# Patient Record
Sex: Female | Born: 1940
Health system: Southern US, Community
[De-identification: ages and names within clinical notes are randomized; demographics above are authoritative.]

## PROBLEM LIST (undated history)

## (undated) DIAGNOSIS — E782 Mixed hyperlipidemia: Secondary | ICD-10-CM

## (undated) DIAGNOSIS — M349 Systemic sclerosis, unspecified: Secondary | ICD-10-CM

## (undated) DIAGNOSIS — G4700 Insomnia, unspecified: Secondary | ICD-10-CM

## (undated) DIAGNOSIS — R5383 Other fatigue: Secondary | ICD-10-CM

## (undated) DIAGNOSIS — R7301 Impaired fasting glucose: Secondary | ICD-10-CM

## (undated) DIAGNOSIS — M2550 Pain in unspecified joint: Secondary | ICD-10-CM

## (undated) DIAGNOSIS — F3289 Other specified depressive episodes: Secondary | ICD-10-CM

## (undated) DIAGNOSIS — R195 Other fecal abnormalities: Secondary | ICD-10-CM

## (undated) DIAGNOSIS — F329 Major depressive disorder, single episode, unspecified: Secondary | ICD-10-CM

## (undated) DIAGNOSIS — F17200 Nicotine dependence, unspecified, uncomplicated: Secondary | ICD-10-CM

## (undated) DIAGNOSIS — E559 Vitamin D deficiency, unspecified: Secondary | ICD-10-CM

## (undated) HISTORY — DX: Insomnia, unspecified: G47.00

## (undated) HISTORY — DX: Major depressive disorder, single episode, unspecified: F32.9

## (undated) HISTORY — PX: TUBAL LIGATION: SHX77

## (undated) HISTORY — DX: Impaired fasting glucose: R73.01

## (undated) HISTORY — DX: Pain in unspecified joint: M25.50

## (undated) HISTORY — DX: Other fecal abnormalities: R19.5

## (undated) HISTORY — DX: Other fatigue: R53.83

## (undated) HISTORY — PX: WISDOM TOOTH EXTRACTION: SHX21

## (undated) HISTORY — DX: Nicotine dependence, unspecified, uncomplicated: F17.200

## (undated) HISTORY — PX: LASIK: SHX215

## (undated) HISTORY — DX: Vitamin D deficiency, unspecified: E55.9

## (undated) HISTORY — DX: Other specified depressive episodes: F32.89

## (undated) HISTORY — DX: Systemic sclerosis, unspecified: M34.9

## (undated) HISTORY — DX: Mixed hyperlipidemia: E78.2

---

## 2003-11-21 ENCOUNTER — Other Ambulatory Visit: Admission: RE | Admit: 2003-11-21 | Discharge: 2003-11-21 | Payer: Self-pay | Admitting: Internal Medicine

## 2004-11-23 ENCOUNTER — Other Ambulatory Visit: Admission: RE | Admit: 2004-11-23 | Discharge: 2004-11-23 | Payer: Self-pay | Admitting: Internal Medicine

## 2005-01-26 ENCOUNTER — Ambulatory Visit (HOSPITAL_COMMUNITY): Admission: RE | Admit: 2005-01-26 | Discharge: 2005-01-26 | Payer: Self-pay | Admitting: Internal Medicine

## 2005-04-30 ENCOUNTER — Encounter: Admission: RE | Admit: 2005-04-30 | Discharge: 2005-04-30 | Payer: Self-pay | Admitting: Internal Medicine

## 2005-11-24 ENCOUNTER — Other Ambulatory Visit: Admission: RE | Admit: 2005-11-24 | Discharge: 2005-11-24 | Payer: Self-pay | Admitting: Internal Medicine

## 2006-11-25 ENCOUNTER — Other Ambulatory Visit: Admission: RE | Admit: 2006-11-25 | Discharge: 2006-11-25 | Payer: Self-pay | Admitting: *Deleted

## 2007-05-04 ENCOUNTER — Encounter: Admission: RE | Admit: 2007-05-04 | Discharge: 2007-05-04 | Payer: Self-pay | Admitting: *Deleted

## 2008-01-16 ENCOUNTER — Other Ambulatory Visit: Admission: RE | Admit: 2008-01-16 | Discharge: 2008-01-16 | Payer: Self-pay | Admitting: Endocrinology

## 2008-05-07 ENCOUNTER — Encounter: Admission: RE | Admit: 2008-05-07 | Discharge: 2008-05-07 | Payer: Self-pay | Admitting: Family Medicine

## 2008-08-19 ENCOUNTER — Other Ambulatory Visit: Admission: RE | Admit: 2008-08-19 | Discharge: 2008-08-19 | Payer: Self-pay | Admitting: Obstetrics and Gynecology

## 2009-04-24 ENCOUNTER — Other Ambulatory Visit: Admission: RE | Admit: 2009-04-24 | Discharge: 2009-04-24 | Payer: Self-pay | Admitting: Obstetrics and Gynecology

## 2009-10-01 ENCOUNTER — Encounter: Admission: RE | Admit: 2009-10-01 | Discharge: 2009-10-01 | Payer: Self-pay | Admitting: *Deleted

## 2009-10-27 ENCOUNTER — Other Ambulatory Visit: Admission: RE | Admit: 2009-10-27 | Discharge: 2009-10-27 | Payer: Self-pay | Admitting: Obstetrics and Gynecology

## 2010-05-12 ENCOUNTER — Other Ambulatory Visit: Admission: RE | Admit: 2010-05-12 | Discharge: 2010-05-12 | Payer: Self-pay | Admitting: Obstetrics and Gynecology

## 2010-11-20 ENCOUNTER — Other Ambulatory Visit (HOSPITAL_COMMUNITY): Payer: Self-pay | Admitting: Internal Medicine

## 2010-11-20 DIAGNOSIS — Z1231 Encounter for screening mammogram for malignant neoplasm of breast: Secondary | ICD-10-CM

## 2010-11-27 ENCOUNTER — Ambulatory Visit (HOSPITAL_COMMUNITY)
Admission: RE | Admit: 2010-11-27 | Discharge: 2010-11-27 | Disposition: A | Discharge status: Home / Self Care | Payer: PRIVATE HEALTH INSURANCE | Source: Ambulatory Visit | Attending: Internal Medicine | Admitting: Internal Medicine

## 2010-11-27 DIAGNOSIS — Z1231 Encounter for screening mammogram for malignant neoplasm of breast: Secondary | ICD-10-CM | POA: Insufficient documentation

## 2011-04-28 ENCOUNTER — Other Ambulatory Visit: Payer: Self-pay | Admitting: Family Medicine

## 2011-04-28 ENCOUNTER — Other Ambulatory Visit: Payer: Self-pay

## 2011-04-28 ENCOUNTER — Other Ambulatory Visit (HOSPITAL_COMMUNITY)
Admission: RE | Admit: 2011-04-28 | Discharge: 2011-04-28 | Disposition: A | Discharge status: Home / Self Care | Payer: PRIVATE HEALTH INSURANCE | Source: Ambulatory Visit | Attending: Internal Medicine | Admitting: Internal Medicine

## 2011-04-28 DIAGNOSIS — Z780 Asymptomatic menopausal state: Secondary | ICD-10-CM

## 2011-04-28 DIAGNOSIS — Z01419 Encounter for gynecological examination (general) (routine) without abnormal findings: Secondary | ICD-10-CM | POA: Insufficient documentation

## 2011-05-05 ENCOUNTER — Ambulatory Visit
Admission: RE | Admit: 2011-05-05 | Discharge: 2011-05-05 | Disposition: A | Discharge status: Home / Self Care | Payer: PRIVATE HEALTH INSURANCE | Source: Ambulatory Visit | Attending: Family Medicine | Admitting: Family Medicine

## 2011-05-05 DIAGNOSIS — Z780 Asymptomatic menopausal state: Secondary | ICD-10-CM

## 2013-09-13 ENCOUNTER — Other Ambulatory Visit: Payer: Self-pay | Admitting: Family Medicine

## 2013-09-13 DIAGNOSIS — Z1231 Encounter for screening mammogram for malignant neoplasm of breast: Secondary | ICD-10-CM

## 2013-09-13 DIAGNOSIS — Z780 Asymptomatic menopausal state: Secondary | ICD-10-CM

## 2013-10-23 ENCOUNTER — Ambulatory Visit
Admission: RE | Admit: 2013-10-23 | Discharge: 2013-10-23 | Disposition: A | Discharge status: Home / Self Care | Payer: Medicare Other | Source: Ambulatory Visit | Attending: Family Medicine | Admitting: Family Medicine

## 2013-10-23 DIAGNOSIS — Z1231 Encounter for screening mammogram for malignant neoplasm of breast: Secondary | ICD-10-CM

## 2013-10-23 DIAGNOSIS — Z780 Asymptomatic menopausal state: Secondary | ICD-10-CM

## 2013-11-09 ENCOUNTER — Ambulatory Visit (INDEPENDENT_AMBULATORY_CARE_PROVIDER_SITE_OTHER): Payer: Medicare Other | Admitting: Interventional Cardiology

## 2013-11-09 ENCOUNTER — Encounter: Payer: Self-pay | Admitting: Interventional Cardiology

## 2013-11-09 VITALS — BP 138/78 | HR 95 | Ht 60.0 in | Wt 127.0 lb

## 2013-11-09 DIAGNOSIS — Z136 Encounter for screening for cardiovascular disorders: Secondary | ICD-10-CM

## 2013-11-09 DIAGNOSIS — F17210 Nicotine dependence, cigarettes, uncomplicated: Secondary | ICD-10-CM | POA: Insufficient documentation

## 2013-11-09 DIAGNOSIS — Z0389 Encounter for observation for other suspected diseases and conditions ruled out: Secondary | ICD-10-CM

## 2013-11-09 DIAGNOSIS — F17200 Nicotine dependence, unspecified, uncomplicated: Secondary | ICD-10-CM

## 2013-11-09 NOTE — Progress Notes (Signed)
Patient ID: Sarah Harrell, female   DOB: August 20, 1941, 73 y.o.   MRN: [id number]     Patient ID: Sarah Harrell MRN: [id number] DOB/AGE: [birth year] 73 y.o.   Referring Physician Dr. Chapman Fitch   Reason for Consultation screening for vascular disease  HPI: 73 year old woman who has a long history of tobacco abuse. She is referred here for vascular screening. She reports no leg pain with walking. She denies chest pain or shortness of breath. She does not report palpitations, dizziness or syncope. Overall, she feels well. She continues to smoke. She is not very interested in stopping smoking. She is trying to stop smoking twice in the past but always reverted back to cigarettes. Overall, she feels like she is active and in good shape.   Current Outpatient Prescriptions  Medication Sig Dispense Refill  . citalopram (CELEXA) 20 MG tablet Take 20 mg by mouth daily.      Marland Kitchen ECHINACEA PO       . glucosamine-chondroitin 500-400 MG tablet Take 1 tablet by mouth daily.      . Multiple Vitamin (MULTIVITAMIN) tablet Take 1 tablet by mouth daily.      . rosuvastatin (CRESTOR) 10 MG tablet Take 10 mg by mouth daily.      . traZODone (DESYREL) 100 MG tablet Take 50 mg by mouth at bedtime.       No current facility-administered medications for this visit.   Past Medical History  Diagnosis Date  . Mixed hyperlipidemia   . Depressive disorder, not elsewhere classified   . Systemic sclerosis   . Insomnia, unspecified   . Tobacco dependence   . Vitamin D deficiency   . Guaiac positive stools     Normal colonoscopy 2009  . Elevated fasting glucose     Glucose 110 as of 09/13/13  . Fatigue   . Joint pain     Family History  Problem Relation Age of Onset  . CAD Mother 1  . Hypertension Father   . Cancer Brother     pancreatic    History   Social History  . Marital Status: Divorced    Spouse Name: N/A    Number of Children: N/A  . Years of Education: N/A   Occupational History  . Not on file.    Social History Main Topics  . Smoking status: Current Every Day Smoker -- 1.00 packs/day    Types: Cigarettes  . Smokeless tobacco: Not on file  . Alcohol Use: Yes     Comment: daily  . Drug Use: No  . Sexual Activity: Not on file   Other Topics Concern  . Not on file   Social History Narrative  . No narrative on file    Past Surgical History  Procedure Laterality Date  . Tubal ligation    . Lasik    . Wisdom tooth extraction        (Not in a hospital admission)  Review of systems complete and found to be negative unless listed above .  No nausea, vomiting.  No fever chills, No focal weakness,  No palpitations.  Physical Exam: Filed Vitals:   11/09/13 1436  BP: 138/78  Pulse: 95    Weight: 127 lb (57.607 kg)  Physical exam:  /AT EOMI No JVD, No carotid bruit RRR S1S2  No wheezing Soft. NT, nondistended No edema. No focal motor or sensory deficits Normal affect  Labs:   No results found for this basename: WBC, HGB, HCT, MCV, PLT  No results found for this basename: NA, K, CL, CO2, BUN, CREATININE, CALCIUM, LABALBU, PROT, BILITOT, ALKPHOS, ALT, AST, GLUCOSE,  in the last 168 hours No results found for this basename: CKTOTAL, CKMB, CKMBINDEX, TROPONINI    No results found for this basename: CHOL   No results found for this basename: HDL   No results found for this basename: LDLCALC   No results found for this basename: TRIG   No results found for this basename: CHOLHDL   No results found for this basename: LDLDIRECT       UUV:OZDGUY  ASSESSMENT AND PLAN:  Hyperlipidemia: Continue Crestor. No myalgias despite vitamin D deficiency.  Tobacco abuse: I encouraged her to stop smoking. She expressed that in the past, when she has tried to stop smoking, some sort of negative life events that happened. Due to this, she is not very interested in stopping. We'll give her the phone number to [facility] smoking cessation hotline.  Vascular screening: She  has palpable posterior tibial pulses bilaterally. She has no claudication.  She does not need lower extremity screening for arterial disease. Given her tobacco abuse history, she needs her aorta screened. We discussed carotid artery screening as well given her smoking history. She will see what her insurance will cover.  I will see her back on an as-needed basis. Recommended she tries to get 150 minutes of exercise per week. She remains active at this time.  Signed:   Mina Marble, MD, [facility] 11/09/2013, 3:17 PM

## 2013-11-09 NOTE — Patient Instructions (Addendum)
Your physician has requested that you have an abdominal aorta duplex. During this test, an ultrasound is used to evaluate the aorta. Allow 30 minutes for this exam. Do not eat after midnight the day before and avoid carbonated beverages.  Your physician has requested that you have a carotid duplex. This test is an ultrasound of the carotid arteries in your neck. It looks at blood flow through these arteries that supply the brain with blood. Allow one hour for this exam. There are no restrictions or special instructions.  Your physician recommends that you schedule a follow-up appointment as needed.    Smoking Cessation Quitting smoking is important to your health and has many advantages. However, it is not always easy to quit since nicotine is a very addictive drug. Often times, people try 3 times or more before being able to quit. This document explains the best ways for you to prepare to quit smoking. Quitting takes hard work and a lot of effort, but you can do it. ADVANTAGES OF QUITTING SMOKING  You will live longer, feel better, and live better.  Your body will feel the impact of quitting smoking almost immediately.  Within 20 minutes, blood pressure decreases. Your pulse returns to its normal level.  After 8 hours, carbon monoxide levels in the blood return to normal. Your oxygen level increases.  After 24 hours, the chance of having a heart attack starts to decrease. Your breath, hair, and body stop smelling like smoke.  After 48 hours, damaged nerve endings begin to recover. Your sense of taste and smell improve.  After 72 hours, the body is virtually free of nicotine. Your bronchial tubes relax and breathing becomes easier.  After 2 to 12 weeks, lungs can hold more air. Exercise becomes easier and circulation improves.  The risk of having a heart attack, stroke, cancer, or lung disease is greatly reduced.  After 1 year, the risk of coronary heart disease is cut in half.  After  5 years, the risk of stroke falls to the same as a nonsmoker.  After 10 years, the risk of lung cancer is cut in half and the risk of other cancers decreases significantly.  After 15 years, the risk of coronary heart disease drops, usually to the level of a nonsmoker.  If you are pregnant, quitting smoking will improve your chances of having a healthy baby.  The people you live with, especially any children, will be healthier.  You will have extra money to spend on things other than cigarettes. QUESTIONS TO THINK ABOUT BEFORE ATTEMPTING TO QUIT You may want to talk about your answers with your caregiver.  Why do you want to quit?  If you tried to quit in the past, what helped and what did not?  What will be the most difficult situations for you after you quit? How will you plan to handle them?  Who can help you through the tough times? Your family? Friends? A caregiver?  What pleasures do you get from smoking? What ways can you still get pleasure if you quit? Here are some questions to ask your caregiver:  How can you help me to be successful at quitting?  What medicine do you think would be best for me and how should I take it?  What should I do if I need more help?  What is smoking withdrawal like? How can I get information on withdrawal? GET READY  Set a quit date.  Change your environment by getting rid of all  cigarettes, ashtrays, matches, and lighters in your home, car, or work. Do not let people smoke in your home.  Review your past attempts to quit. Think about what worked and what did not. GET SUPPORT AND ENCOURAGEMENT You have a better chance of being successful if you have help. You can get support in many ways.  Tell your family, friends, and co-workers that you are going to quit and need their support. Ask them not to smoke around you.  Get individual, group, or telephone counseling and support. Programs are available at [facility] and health centers.  Call your local health department for information about programs in your area.  Spiritual beliefs and practices may help some smokers quit.  Download a "quit meter" on your computer to keep track of quit statistics, such as how long you have gone without smoking, cigarettes not smoked, and money saved.  Get a self-help book about quitting smoking and staying off of tobacco. [place] yourself from urges to smoke. Talk to someone, go for a walk, or occupy your time with a task.  Change your normal routine. Take a different route to work. Drink tea instead of coffee. Eat breakfast in a different place.  Reduce your stress. Take a hot bath, exercise, or read a book.  Plan something enjoyable to do every day. Reward yourself for not smoking.  Explore interactive web-based programs that specialize in helping you quit. GET MEDICINE AND USE IT CORRECTLY Medicines can help you stop smoking and decrease the urge to smoke. Combining medicine with the above behavioral methods and support can greatly increase your chances of successfully quitting smoking.  Nicotine replacement therapy helps deliver nicotine to your body without the negative effects and risks of smoking. Nicotine replacement therapy includes nicotine gum, lozenges, inhalers, nasal sprays, and skin patches. Some may be available over-the-counter and others require a prescription.  Antidepressant medicine helps people abstain from smoking, but how this works is unknown. This medicine is available by prescription.  Nicotinic receptor partial agonist medicine simulates the effect of nicotine in your brain. This medicine is available by prescription. Ask your caregiver for advice about which medicines to use and how to use them based on your health history. Your caregiver will tell you what side effects to look out for if you choose to be on a medicine or therapy. Carefully read the information on the package.  Do not use any other product containing nicotine while using a nicotine replacement product.  RELAPSE OR DIFFICULT SITUATIONS Most relapses occur within the first 3 months after quitting. Do not be discouraged if you start smoking again. Remember, most people try several times before finally quitting. You may have symptoms of withdrawal because your body is used to nicotine. You may crave cigarettes, be irritable, feel very hungry, cough often, get headaches, or have difficulty concentrating. The withdrawal symptoms are only temporary. They are strongest when you first quit, but they will go away within 10 14 days. To reduce the chances of relapse, try to:  Avoid drinking alcohol. Drinking lowers your chances of successfully quitting.  Reduce the amount of caffeine you consume. Once you quit smoking, the amount of caffeine in your body increases and can give you symptoms, such as a rapid heartbeat, sweating, and anxiety.  Avoid smokers because they can make you want to smoke.  Do not let weight gain distract you. Many smokers will gain weight when they quit, usually less than 10 pounds. Eat  a healthy diet and stay active. You can always lose the weight gained after you quit.  Find ways to improve your mood other than smoking. FOR MORE INFORMATION  www.smokefree.gov  Document Released: 09/07/2001 Document Revised: 03/14/2012 Document Reviewed: 12/23/2011 [facility] Patient Information 2014 [place], [place].

## 2013-11-20 ENCOUNTER — Encounter: Payer: Self-pay | Admitting: Cardiology

## 2013-11-20 ENCOUNTER — Ambulatory Visit (HOSPITAL_BASED_OUTPATIENT_CLINIC_OR_DEPARTMENT_OTHER): Payer: Medicare Other

## 2013-11-20 ENCOUNTER — Ambulatory Visit (HOSPITAL_COMMUNITY): Payer: Medicare Other | Attending: Interventional Cardiology

## 2013-11-20 DIAGNOSIS — Z1389 Encounter for screening for other disorder: Secondary | ICD-10-CM | POA: Insufficient documentation

## 2013-11-20 DIAGNOSIS — E785 Hyperlipidemia, unspecified: Secondary | ICD-10-CM | POA: Insufficient documentation

## 2013-11-20 DIAGNOSIS — F17200 Nicotine dependence, unspecified, uncomplicated: Secondary | ICD-10-CM | POA: Insufficient documentation

## 2013-11-20 DIAGNOSIS — Z136 Encounter for screening for cardiovascular disorders: Secondary | ICD-10-CM

## 2013-11-20 DIAGNOSIS — Z0000 Encounter for general adult medical examination without abnormal findings: Secondary | ICD-10-CM

## 2014-03-04 ENCOUNTER — Other Ambulatory Visit: Payer: Self-pay | Admitting: Gastroenterology

## 2014-10-14 ENCOUNTER — Other Ambulatory Visit: Payer: Self-pay

## 2014-10-14 DIAGNOSIS — Z1231 Encounter for screening mammogram for malignant neoplasm of breast: Secondary | ICD-10-CM

## 2014-10-25 ENCOUNTER — Ambulatory Visit
Admission: RE | Admit: 2014-10-25 | Discharge: 2014-10-25 | Disposition: A | Discharge status: Home / Self Care | Payer: Medicare Other | Source: Ambulatory Visit

## 2014-10-25 DIAGNOSIS — Z1231 Encounter for screening mammogram for malignant neoplasm of breast: Secondary | ICD-10-CM

## 2014-12-06 ENCOUNTER — Other Ambulatory Visit: Payer: Self-pay | Admitting: Family Medicine

## 2014-12-09 ENCOUNTER — Other Ambulatory Visit: Payer: Self-pay | Admitting: Family Medicine

## 2014-12-09 DIAGNOSIS — F17200 Nicotine dependence, unspecified, uncomplicated: Secondary | ICD-10-CM

## 2015-12-08 ENCOUNTER — Other Ambulatory Visit: Payer: Self-pay

## 2015-12-08 DIAGNOSIS — Z1231 Encounter for screening mammogram for malignant neoplasm of breast: Secondary | ICD-10-CM

## 2015-12-18 ENCOUNTER — Ambulatory Visit
Admission: RE | Admit: 2015-12-18 | Discharge: 2015-12-18 | Disposition: A | Discharge status: Home / Self Care | Payer: Medicare Other | Source: Ambulatory Visit

## 2015-12-18 DIAGNOSIS — Z1231 Encounter for screening mammogram for malignant neoplasm of breast: Secondary | ICD-10-CM

## 2016-09-14 ENCOUNTER — Other Ambulatory Visit: Payer: Self-pay | Admitting: Family

## 2016-09-14 ENCOUNTER — Other Ambulatory Visit: Payer: Self-pay | Admitting: Family Medicine

## 2016-09-14 DIAGNOSIS — R52 Pain, unspecified: Secondary | ICD-10-CM

## 2016-09-22 ENCOUNTER — Other Ambulatory Visit: Payer: Self-pay | Admitting: Family

## 2016-09-22 ENCOUNTER — Ambulatory Visit
Admission: RE | Admit: 2016-09-22 | Discharge: 2016-09-22 | Disposition: A | Discharge status: Home / Self Care | Payer: Medicare Other | Source: Ambulatory Visit | Attending: Family | Admitting: Family

## 2016-09-22 DIAGNOSIS — M25551 Pain in right hip: Secondary | ICD-10-CM

## 2016-10-07 DIAGNOSIS — Z0120 Encounter for dental examination and cleaning without abnormal findings: Secondary | ICD-10-CM | POA: Diagnosis not present

## 2016-10-25 DIAGNOSIS — R911 Solitary pulmonary nodule: Secondary | ICD-10-CM | POA: Diagnosis not present

## 2016-10-25 DIAGNOSIS — R69 Illness, unspecified: Secondary | ICD-10-CM | POA: Diagnosis not present

## 2016-10-25 DIAGNOSIS — E782 Mixed hyperlipidemia: Secondary | ICD-10-CM | POA: Diagnosis not present

## 2016-10-25 DIAGNOSIS — Z79899 Other long term (current) drug therapy: Secondary | ICD-10-CM | POA: Diagnosis not present

## 2016-10-26 DIAGNOSIS — E782 Mixed hyperlipidemia: Secondary | ICD-10-CM | POA: Diagnosis not present

## 2016-10-26 DIAGNOSIS — Z79899 Other long term (current) drug therapy: Secondary | ICD-10-CM | POA: Diagnosis not present

## 2016-11-03 ENCOUNTER — Other Ambulatory Visit: Payer: Self-pay | Admitting: Family

## 2016-11-03 DIAGNOSIS — R911 Solitary pulmonary nodule: Secondary | ICD-10-CM

## 2017-01-03 DIAGNOSIS — Z13820 Encounter for screening for osteoporosis: Secondary | ICD-10-CM | POA: Diagnosis not present

## 2017-01-19 DIAGNOSIS — E7800 Pure hypercholesterolemia, unspecified: Secondary | ICD-10-CM | POA: Diagnosis not present

## 2017-01-19 DIAGNOSIS — Z6825 Body mass index (BMI) 25.0-25.9, adult: Secondary | ICD-10-CM | POA: Diagnosis not present

## 2017-01-19 DIAGNOSIS — Z7982 Long term (current) use of aspirin: Secondary | ICD-10-CM | POA: Diagnosis not present

## 2017-01-19 DIAGNOSIS — Z0000 Encounter for general adult medical examination without abnormal findings: Secondary | ICD-10-CM | POA: Diagnosis not present

## 2017-01-19 DIAGNOSIS — G4700 Insomnia, unspecified: Secondary | ICD-10-CM | POA: Diagnosis not present

## 2017-01-19 DIAGNOSIS — Z79899 Other long term (current) drug therapy: Secondary | ICD-10-CM | POA: Diagnosis not present

## 2017-01-19 DIAGNOSIS — R69 Illness, unspecified: Secondary | ICD-10-CM | POA: Diagnosis not present

## 2017-01-20 ENCOUNTER — Other Ambulatory Visit: Payer: Self-pay | Admitting: Family

## 2017-01-20 DIAGNOSIS — Z1231 Encounter for screening mammogram for malignant neoplasm of breast: Secondary | ICD-10-CM

## 2017-01-20 DIAGNOSIS — Z0120 Encounter for dental examination and cleaning without abnormal findings: Secondary | ICD-10-CM | POA: Diagnosis not present

## 2017-01-28 DIAGNOSIS — L821 Other seborrheic keratosis: Secondary | ICD-10-CM | POA: Diagnosis not present

## 2017-01-28 DIAGNOSIS — L728 Other follicular cysts of the skin and subcutaneous tissue: Secondary | ICD-10-CM | POA: Diagnosis not present

## 2017-01-28 DIAGNOSIS — L814 Other melanin hyperpigmentation: Secondary | ICD-10-CM | POA: Diagnosis not present

## 2017-01-28 DIAGNOSIS — L820 Inflamed seborrheic keratosis: Secondary | ICD-10-CM | POA: Diagnosis not present

## 2017-01-28 DIAGNOSIS — D225 Melanocytic nevi of trunk: Secondary | ICD-10-CM | POA: Diagnosis not present

## 2017-02-09 ENCOUNTER — Ambulatory Visit
Admission: RE | Admit: 2017-02-09 | Discharge: 2017-02-09 | Disposition: A | Discharge status: Home / Self Care | Payer: Medicare HMO | Source: Ambulatory Visit | Attending: Family | Admitting: Family

## 2017-02-09 DIAGNOSIS — Z1231 Encounter for screening mammogram for malignant neoplasm of breast: Secondary | ICD-10-CM | POA: Diagnosis not present

## 2017-03-07 DIAGNOSIS — Z0120 Encounter for dental examination and cleaning without abnormal findings: Secondary | ICD-10-CM | POA: Diagnosis not present

## 2017-05-27 DIAGNOSIS — R69 Illness, unspecified: Secondary | ICD-10-CM | POA: Diagnosis not present

## 2017-05-27 DIAGNOSIS — R911 Solitary pulmonary nodule: Secondary | ICD-10-CM | POA: Diagnosis not present

## 2017-05-27 DIAGNOSIS — E782 Mixed hyperlipidemia: Secondary | ICD-10-CM | POA: Diagnosis not present

## 2017-05-27 DIAGNOSIS — R7301 Impaired fasting glucose: Secondary | ICD-10-CM | POA: Diagnosis not present

## 2017-05-27 DIAGNOSIS — E559 Vitamin D deficiency, unspecified: Secondary | ICD-10-CM | POA: Diagnosis not present

## 2017-06-08 DIAGNOSIS — Z961 Presence of intraocular lens: Secondary | ICD-10-CM | POA: Diagnosis not present

## 2017-06-08 DIAGNOSIS — H26493 Other secondary cataract, bilateral: Secondary | ICD-10-CM | POA: Diagnosis not present

## 2017-06-08 DIAGNOSIS — H04123 Dry eye syndrome of bilateral lacrimal glands: Secondary | ICD-10-CM | POA: Diagnosis not present

## 2017-06-08 DIAGNOSIS — H02831 Dermatochalasis of right upper eyelid: Secondary | ICD-10-CM | POA: Diagnosis not present

## 2017-07-30 DIAGNOSIS — R69 Illness, unspecified: Secondary | ICD-10-CM | POA: Diagnosis not present

## 2017-10-24 DIAGNOSIS — M47816 Spondylosis without myelopathy or radiculopathy, lumbar region: Secondary | ICD-10-CM | POA: Diagnosis not present

## 2017-10-24 DIAGNOSIS — M5126 Other intervertebral disc displacement, lumbar region: Secondary | ICD-10-CM | POA: Diagnosis not present

## 2017-10-24 DIAGNOSIS — Z0120 Encounter for dental examination and cleaning without abnormal findings: Secondary | ICD-10-CM | POA: Diagnosis not present

## 2017-10-24 DIAGNOSIS — R69 Illness, unspecified: Secondary | ICD-10-CM | POA: Diagnosis not present

## 2017-11-22 DIAGNOSIS — Z79899 Other long term (current) drug therapy: Secondary | ICD-10-CM | POA: Diagnosis not present

## 2017-11-22 DIAGNOSIS — Z0000 Encounter for general adult medical examination without abnormal findings: Secondary | ICD-10-CM | POA: Diagnosis not present

## 2017-11-22 DIAGNOSIS — M85859 Other specified disorders of bone density and structure, unspecified thigh: Secondary | ICD-10-CM | POA: Diagnosis not present

## 2017-11-22 DIAGNOSIS — E782 Mixed hyperlipidemia: Secondary | ICD-10-CM | POA: Diagnosis not present

## 2017-11-22 DIAGNOSIS — R69 Illness, unspecified: Secondary | ICD-10-CM | POA: Diagnosis not present

## 2017-11-22 DIAGNOSIS — E559 Vitamin D deficiency, unspecified: Secondary | ICD-10-CM | POA: Diagnosis not present

## 2017-11-22 DIAGNOSIS — Z13820 Encounter for screening for osteoporosis: Secondary | ICD-10-CM | POA: Diagnosis not present

## 2017-11-22 DIAGNOSIS — R062 Wheezing: Secondary | ICD-10-CM | POA: Diagnosis not present

## 2017-11-22 DIAGNOSIS — Z1389 Encounter for screening for other disorder: Secondary | ICD-10-CM | POA: Diagnosis not present

## 2017-11-22 DIAGNOSIS — R911 Solitary pulmonary nodule: Secondary | ICD-10-CM | POA: Diagnosis not present

## 2017-11-23 DIAGNOSIS — E782 Mixed hyperlipidemia: Secondary | ICD-10-CM | POA: Diagnosis not present

## 2017-11-23 DIAGNOSIS — Z79899 Other long term (current) drug therapy: Secondary | ICD-10-CM | POA: Diagnosis not present

## 2017-11-24 ENCOUNTER — Other Ambulatory Visit: Payer: Medicare HMO

## 2017-11-24 ENCOUNTER — Ambulatory Visit: Payer: Medicare HMO | Admitting: Internal Medicine

## 2017-11-24 ENCOUNTER — Ambulatory Visit (INDEPENDENT_AMBULATORY_CARE_PROVIDER_SITE_OTHER)
Admission: RE | Admit: 2017-11-24 | Discharge: 2017-11-24 | Disposition: A | Discharge status: Home / Self Care | Payer: Medicare HMO | Source: Ambulatory Visit | Attending: Internal Medicine | Admitting: Internal Medicine

## 2017-11-24 ENCOUNTER — Encounter: Payer: Self-pay | Admitting: Internal Medicine

## 2017-11-24 VITALS — BP 116/72 | HR 116 | Ht 60.0 in | Wt 125.0 lb

## 2017-11-24 DIAGNOSIS — F17210 Nicotine dependence, cigarettes, uncomplicated: Secondary | ICD-10-CM | POA: Diagnosis not present

## 2017-11-24 DIAGNOSIS — IMO0001 Reserved for inherently not codable concepts without codable children: Secondary | ICD-10-CM

## 2017-11-24 DIAGNOSIS — R05 Cough: Secondary | ICD-10-CM | POA: Diagnosis not present

## 2017-11-24 DIAGNOSIS — R053 Chronic cough: Secondary | ICD-10-CM | POA: Insufficient documentation

## 2017-11-24 DIAGNOSIS — R911 Solitary pulmonary nodule: Secondary | ICD-10-CM | POA: Diagnosis not present

## 2017-11-24 DIAGNOSIS — R69 Illness, unspecified: Secondary | ICD-10-CM | POA: Diagnosis not present

## 2017-11-24 NOTE — Progress Notes (Signed)
   Subjective:    Patient ID: Sarah Harrell, female    DOB: 27-Aug-1941, 77 y.o.   MRN: [id number]  HPI    Review of Systems  Constitutional: Negative for fever and unexpected weight change.  HENT: Negative for congestion, dental problem, ear pain, nosebleeds, postnasal drip, rhinorrhea, sinus pressure, sneezing, sore throat and trouble swallowing.   Eyes: Negative for redness and itching.  Respiratory: Positive for cough. Negative for chest tightness, shortness of breath and wheezing.   Cardiovascular: Negative for palpitations and leg swelling.  Gastrointestinal: Negative for nausea and vomiting.  Genitourinary: Negative for dysuria.  Musculoskeletal: Negative for joint swelling.  Skin: Negative for rash.  Neurological: Negative for headaches.  Hematological: Does not bruise/bleed easily.  Psychiatric/Behavioral: Negative for dysphoric mood. The patient is not nervous/anxious.        Objective:   Physical Exam        Assessment & Plan:

## 2017-11-24 NOTE — Patient Instructions (Signed)
The key is to stop smoking completely before smoking completely stops you - clearly it's not too late!   Please remember to go to the  x-ray department downstairs in the basement  for your tests - we will call you with the results when they are available.      Pulmonary follow up is as needed

## 2017-11-24 NOTE — Progress Notes (Addendum)
Subjective:     Patient ID: Sarah Harrell, female   DOB: 10-Jan-1941,    MRN: [id number]  HPI  29  yowf active smoker with am cough  X 2017 ? Worse in winter with ? H/o lung nodule per [facility] office in 08/04/15 > [facility] Levels ordered CT 11/03/16 but never done so referred to pulmonary clinic 11/24/2017 by Dr   Christ Kick  / Fara Olden     11/24/2017 1st [facility] office visit/ Wert   Chief Complaint  Patient presents with  . Pulmonary Consult    referred for lung nodule, smoker, no symptoms at this time ,   dyspnea:   Not limited by breathing from desired activities  / able to do "african dancing course" - very aerobic ok Cough worst in am but scant mucoid sputum  Sleeps fine No need for saba though was provided one by Dr Fara Olden prn wheeze.  No obvious day to day or daytime variability or assoc  purulent sputum or mucus plugs or hemoptysis or cp or chest tightness, subjective wheeze or overt sinus or hb symptoms. No unusual exposure hx or h/o childhood pna/ asthma or knowledge of premature birth.  Sleeping ok flat without nocturnal  or early am exacerbation  of respiratory  c/o's or need for noct saba. Also denies any obvious fluctuation of symptoms with weather or environmental changes or other aggravating or alleviating factors except as outlined above   Current Allergies, Complete Past Medical History, Past Surgical History, Family History, and Social History were reviewed in [employer] record.  ROS  The following are not active complaints unless bolded Hoarseness, sore throat, dysphagia, dental problems, itching, sneezing,  nasal congestion or discharge of excess mucus or purulent secretions, ear ache,   fever, chills, sweats, unintended wt loss or wt gain, classically pleuritic or exertional cp,  orthopnea pnd or leg swelling, presyncope, palpitations, abdominal pain, anorexia, nausea, vomiting, diarrhea  or change in bowel habits or change in  bladder habits, change in stools or change in urine, dysuria, hematuria,  rash, arthralgias, visual complaints, headache, numbness, weakness or ataxia or problems with walking or coordination,  change in mood/affect or memory.        Current Meds  Medication Sig  . citalopram (CELEXA) 20 MG tablet Take 20 mg by mouth daily.  Marland Kitchen ECHINACEA PO   . glucosamine-chondroitin 500-400 MG tablet Take 1 tablet by mouth daily.  . Multiple Vitamin (MULTIVITAMIN) tablet Take 1 tablet by mouth daily.  . rosuvastatin (CRESTOR) 10 MG tablet Take 10 mg by mouth daily.  . traZODone (DESYREL) 100 MG tablet Take 50 mg by mouth at bedtime.         Review of Systems     Objective:   Physical Exam  amb wf nad  Wt Readings from Last 3 Encounters:  11/24/17 125 lb (56.7 kg)  11/09/13 127 lb (57.6 kg)     Vital signs reviewed - Note on arrival 02 sats  91% on RA     HEENT: nl dentition, turbinates bilaterally, and oropharynx. Nl external ear canals without cough reflex   NECK :  without JVD/Nodes/TM/ nl carotid upstrokes bilaterally   LUNGS: no acc muscle use,  Nl contour chest which is clear to A and P bilaterally without cough on insp or exp maneuvers   CV:  RRR  no s3 or murmur or increase in P2, and no edema   ABD:  soft and nontender with nl inspiratory excursion  in the supine position. No bruits or organomegaly appreciated, bowel sounds nl  MS:  Nl gait/ ext warm without deformities, calf tenderness, cyanosis or clubbing No obvious joint restrictions   SKIN: warm and dry without lesions    NEURO:  alert, approp, nl sensorium with  no motor or cerebellar deficits apparent.    CXR PA and Lateral:   11/24/2017 :    I personally reviewed images and agree with radiology impression as follows:   1. No evidence of active disease. 2. Large lung volumes.     Assessment:

## 2017-11-26 ENCOUNTER — Encounter: Payer: Self-pay | Admitting: Internal Medicine

## 2017-11-26 DIAGNOSIS — IMO0001 Reserved for inherently not codable concepts without codable children: Secondary | ICD-10-CM | POA: Insufficient documentation

## 2017-11-26 DIAGNOSIS — R911 Solitary pulmonary nodule: Secondary | ICD-10-CM | POA: Insufficient documentation

## 2017-11-26 NOTE — Assessment & Plan Note (Signed)
Spirometry 11/24/2017  FEV1 1.41 (80%)  Ratio 76 with abn f/v not physiologic    She may be developing cb but does not have significant copd at this point

## 2017-11-26 NOTE — Assessment & Plan Note (Signed)
See CT chest 08/04/15  X 2 mm RLL nodule  - Repeat Low dose CT Chest  Discussed in detail all the  indications, usual  risks and alternatives  relative to the benefits with patient who agrees to proceed with w/u as outlined.

## 2017-11-26 NOTE — Assessment & Plan Note (Signed)
>   3 min discussion I reviewed the Fletcher curve with the patient that basically indicates  if you quit smoking when your best day FEV1 is still well preserved (as is clearly  the case here)  it is highly unlikely you will progress to severe disease and informed the patient there was  no medication on the market that has proven to alter the curve/ its downward trajectory  or the likelihood of progression of their disease(unlike other chronic medical conditions such as atheroclerosis where we do think we can change the natural hx with risk reducing meds)    Therefore stopping smoking and maintaining abstinence are  the most important aspects of care, not choice of inhalers or for that matter, doctors.   Treatment other than smoking cessation  is entirely directed by severity of symptoms and focused also on reducing exacerbations, not attempting to change the natural history of the disease.    Since she has such min symtpoms with nl cxr rec D/c cigs Low-dose CT lung cancer screening is recommended for patients who are 23-26 years of age with a 30+ pack-year history of smoking, and who are currently smoking or quit <=15 years ago so meets criteria> rec

## 2017-12-06 DIAGNOSIS — M8588 Other specified disorders of bone density and structure, other site: Secondary | ICD-10-CM | POA: Diagnosis not present

## 2018-02-27 DIAGNOSIS — H02831 Dermatochalasis of right upper eyelid: Secondary | ICD-10-CM | POA: Diagnosis not present

## 2018-02-27 DIAGNOSIS — H04123 Dry eye syndrome of bilateral lacrimal glands: Secondary | ICD-10-CM | POA: Diagnosis not present

## 2018-02-27 DIAGNOSIS — H26493 Other secondary cataract, bilateral: Secondary | ICD-10-CM | POA: Diagnosis not present

## 2018-02-27 DIAGNOSIS — Z961 Presence of intraocular lens: Secondary | ICD-10-CM | POA: Diagnosis not present

## 2018-03-22 ENCOUNTER — Other Ambulatory Visit: Payer: Self-pay | Admitting: Internal Medicine

## 2018-03-22 DIAGNOSIS — Z1231 Encounter for screening mammogram for malignant neoplasm of breast: Secondary | ICD-10-CM

## 2018-04-12 ENCOUNTER — Ambulatory Visit
Admission: RE | Admit: 2018-04-12 | Discharge: 2018-04-12 | Disposition: A | Discharge status: Home / Self Care | Payer: Medicare HMO | Source: Ambulatory Visit | Attending: Internal Medicine | Admitting: Internal Medicine

## 2018-04-12 DIAGNOSIS — Z1231 Encounter for screening mammogram for malignant neoplasm of breast: Secondary | ICD-10-CM

## 2018-06-05 ENCOUNTER — Other Ambulatory Visit: Payer: Self-pay | Admitting: Internal Medicine

## 2018-06-05 DIAGNOSIS — E782 Mixed hyperlipidemia: Secondary | ICD-10-CM | POA: Diagnosis not present

## 2018-06-05 DIAGNOSIS — Z23 Encounter for immunization: Secondary | ICD-10-CM | POA: Diagnosis not present

## 2018-06-05 DIAGNOSIS — R69 Illness, unspecified: Secondary | ICD-10-CM | POA: Diagnosis not present

## 2018-06-05 DIAGNOSIS — R911 Solitary pulmonary nodule: Secondary | ICD-10-CM | POA: Diagnosis not present

## 2018-06-08 ENCOUNTER — Ambulatory Visit: Payer: Medicare HMO | Admitting: Internal Medicine

## 2018-06-08 ENCOUNTER — Encounter: Payer: Self-pay | Admitting: Internal Medicine

## 2018-06-08 VITALS — BP 138/92 | HR 99 | Ht 60.0 in | Wt 125.6 lb

## 2018-06-08 DIAGNOSIS — IMO0001 Reserved for inherently not codable concepts without codable children: Secondary | ICD-10-CM

## 2018-06-08 DIAGNOSIS — R053 Chronic cough: Secondary | ICD-10-CM

## 2018-06-08 DIAGNOSIS — R911 Solitary pulmonary nodule: Secondary | ICD-10-CM | POA: Diagnosis not present

## 2018-06-08 DIAGNOSIS — R05 Cough: Secondary | ICD-10-CM | POA: Diagnosis not present

## 2018-06-08 DIAGNOSIS — R69 Illness, unspecified: Secondary | ICD-10-CM | POA: Diagnosis not present

## 2018-06-08 DIAGNOSIS — F17210 Nicotine dependence, cigarettes, uncomplicated: Secondary | ICD-10-CM

## 2018-06-08 NOTE — Progress Notes (Signed)
Subjective:     Patient ID: Sarah Harrell, female   DOB: 09/15/1941,    MRN: [id number]    Brief patient profile:  60  yowf active smoker with am cough  X 2017 ? Worse in winter with ? H/o lung nodule per [facility] office in 08/04/15 > Eloise Levels ordered CT 11/03/16 but never done so referred to pulmonary clinic 11/24/2017 by Dr   Christ Kick  / Fara Olden    History of Present Illness  11/24/2017 1st [facility] office visit/ Dimitri Dsouza   Chief Complaint  Patient presents with  . Pulmonary Consult    referred for lung nodule, smoker, no symptoms at this time ,   dyspnea:   Not limited by breathing from desired activities  / able to do "african dancing course" - very aerobic ok Cough worst in am but scant mucoid sputum  Sleeps fine No need for saba though was provided one by Dr Fara Olden prn wheeze. rec The key is to stop smoking completely before smoking completely stops you - clearly it's not too late! Please remember to go to the  x-ray department downstairs in the basement  for your tests - we will call you with the results when they are available.     06/08/2018  f/u ov/Siena Poehler re: smoker with nl spirometry/ uacs / f/u lung nodule not seen on plain cxr  Chief Complaint  Patient presents with  . Follow-up    nose blowing several times a day, eyes watering  Dyspnea:  Not limited by breathing from desired activities   Cough: mostly in am's assoc with pnds Sleeping: fine flat SABA use: none and no resp rx at all  02: no   No obvious day to day or daytime variability or assoc excess/ purulent sputum or mucus plugs or hemoptysis or cp or chest tightness, subjective wheeze or overt sinus or hb symptoms.   Sleeping as above  without nocturnal  or early am exacerbation  of respiratory  c/o's or need for noct saba. Also denies any obvious fluctuation of symptoms with weather or environmental changes or other aggravating or alleviating factors except as outlined above   No  unusual exposure hx or h/o childhood pna/ asthma or knowledge of premature birth.  Current Allergies, Complete Past Medical History, Past Surgical History, Family History, and Social History were reviewed in [employer] record.  ROS  The following are not active complaints unless bolded Hoarseness, sore throat, dysphagia, dental problems, itching, sneezing,  nasal congestion or discharge of excess mucus or purulent secretions, ear ache,   fever, chills, sweats, unintended wt loss or wt gain, classically pleuritic or exertional cp,  orthopnea pnd or arm/hand swelling  or leg swelling, presyncope, palpitations, abdominal pain, anorexia, nausea, vomiting, diarrhea  or change in bowel habits or change in bladder habits, change in stools or change in urine, dysuria, hematuria,  rash, arthralgias, visual complaints, headache, numbness, weakness or ataxia or problems with walking or coordination,  change in mood or  memory.        Current Meds  Medication Sig  . citalopram (CELEXA) 20 MG tablet Take 20 mg by mouth daily.  . Multiple Vitamin (MULTIVITAMIN) tablet Take 1 tablet by mouth daily.  . rosuvastatin (CRESTOR) 10 MG tablet Take 10 mg by mouth daily.                               Objective:  Physical Exam  amb wf nad  06/08/2018        125   11/24/17 125 lb (56.7 kg)  11/09/13 127 lb (57.6 kg)     Vital signs reviewed - Note on arrival 02 sats  93% on RA    And bp  138/92    HEENT: nl dentition, turbinates bilaterally, and oropharynx. Nl external ear canals without cough reflex   NECK :  without JVD/Nodes/TM/ nl carotid upstrokes bilaterally   LUNGS: no acc muscle use,  Nl contour chest which is clear to A and P bilaterally without cough on insp or exp maneuvers   CV:  RRR  no s3 or murmur or increase in P2, and no edema   ABD:  soft and nontender with nl inspiratory excursion in the supine position. No bruits or organomegaly appreciated, bowel  sounds nl  MS:  Nl gait/ ext warm without deformities, calf tenderness, cyanosis or clubbing No obvious joint restrictions   SKIN: warm and dry without lesions    NEURO:  alert, approp, nl sensorium with  no motor or cerebellar deficits apparent.          Assessment:

## 2018-06-08 NOTE — Patient Instructions (Signed)
Low-dose CT lung cancer screening is recommended for patients who are 21-75 years of age with a 30+ pack-year history of smoking, and who are currently smoking or quit <=15 years ago.    The key is to stop smoking completely before smoking completely stops you!    Pulmonary follow up is as needed

## 2018-06-09 ENCOUNTER — Encounter: Payer: Self-pay | Admitting: Internal Medicine

## 2018-06-09 NOTE — Assessment & Plan Note (Signed)
See CT chest 08/04/15  X 2 mm RLL nodule  - Repeat Low dose CT Chest 11/26/2017 >>>not done > reordered for 06/12/18    Discussed in detail all the  indications, usual  risks and alternatives  relative to the benefits with patient who agrees to proceed with w/u as outlined.

## 2018-06-09 NOTE — Assessment & Plan Note (Signed)
4-5 min discussion re active cigarette smoking in addition to office E&M ° °Ask about tobacco use:   Ongoing  °Advise quitting   I took an extended  opportunity with this patient to outline the consequences of continued cigarette use  in airway disorders based on all the data we have from the multiple national lung health studies (perfomed over decades at millions of dollars in cost)  indicating that smoking cessation, not choice of inhalers or physicians, is the most important aspect of care.   °Assess willingness:  Not committed at this point °Assist in quit attempt:  Per PCP when ready °Arrange follow up:   Follow up per Primary Care planned  °  °  °

## 2018-06-09 NOTE — Assessment & Plan Note (Signed)
Spirometry 11/24/2017  FEV1 1.41 (80%)  Ratio 76 with abn f/v not physiologic    Due to cough which is likely related to pnds she did not have physiologic spirometric pattern and if needs T surgery eval will need to return for repeat pfts/ advised

## 2018-06-12 ENCOUNTER — Ambulatory Visit
Admission: RE | Admit: 2018-06-12 | Discharge: 2018-06-12 | Disposition: A | Discharge status: Home / Self Care | Payer: Medicare HMO | Source: Ambulatory Visit | Attending: Internal Medicine | Admitting: Internal Medicine

## 2018-06-12 DIAGNOSIS — R911 Solitary pulmonary nodule: Secondary | ICD-10-CM | POA: Diagnosis not present

## 2018-06-12 MED ORDER — IOPAMIDOL (ISOVUE-300) INJECTION 61%
75.0000 mL | Freq: Once | INTRAVENOUS | Status: AC | PRN
  Administered 2018-06-12: 75 mL via INTRAVENOUS

## 2018-09-15 ENCOUNTER — Other Ambulatory Visit: Payer: Self-pay | Admitting: Internal Medicine

## 2018-09-15 ENCOUNTER — Ambulatory Visit
Admission: RE | Admit: 2018-09-15 | Discharge: 2018-09-15 | Disposition: A | Discharge status: Home / Self Care | Payer: Medicare HMO | Source: Ambulatory Visit | Attending: Internal Medicine | Admitting: Internal Medicine

## 2018-09-15 DIAGNOSIS — Z87891 Personal history of nicotine dependence: Secondary | ICD-10-CM | POA: Diagnosis not present

## 2018-09-15 DIAGNOSIS — R112 Nausea with vomiting, unspecified: Secondary | ICD-10-CM | POA: Diagnosis not present

## 2018-09-15 DIAGNOSIS — R1084 Generalized abdominal pain: Secondary | ICD-10-CM | POA: Diagnosis not present

## 2018-09-15 DIAGNOSIS — K5901 Slow transit constipation: Secondary | ICD-10-CM | POA: Diagnosis not present

## 2018-10-06 DIAGNOSIS — M85859 Other specified disorders of bone density and structure, unspecified thigh: Secondary | ICD-10-CM | POA: Diagnosis not present

## 2018-10-06 DIAGNOSIS — R911 Solitary pulmonary nodule: Secondary | ICD-10-CM | POA: Diagnosis not present

## 2018-10-06 DIAGNOSIS — E782 Mixed hyperlipidemia: Secondary | ICD-10-CM | POA: Diagnosis not present

## 2018-10-06 DIAGNOSIS — E876 Hypokalemia: Secondary | ICD-10-CM | POA: Diagnosis not present

## 2018-11-24 DIAGNOSIS — Z0000 Encounter for general adult medical examination without abnormal findings: Secondary | ICD-10-CM | POA: Diagnosis not present

## 2018-11-24 DIAGNOSIS — Z79899 Other long term (current) drug therapy: Secondary | ICD-10-CM | POA: Diagnosis not present

## 2018-11-24 DIAGNOSIS — M85859 Other specified disorders of bone density and structure, unspecified thigh: Secondary | ICD-10-CM | POA: Diagnosis not present

## 2018-11-24 DIAGNOSIS — R911 Solitary pulmonary nodule: Secondary | ICD-10-CM | POA: Diagnosis not present

## 2018-11-24 DIAGNOSIS — R69 Illness, unspecified: Secondary | ICD-10-CM | POA: Diagnosis not present

## 2018-11-24 DIAGNOSIS — E559 Vitamin D deficiency, unspecified: Secondary | ICD-10-CM | POA: Diagnosis not present

## 2018-11-24 DIAGNOSIS — E782 Mixed hyperlipidemia: Secondary | ICD-10-CM | POA: Diagnosis not present

## 2018-11-24 DIAGNOSIS — Z1389 Encounter for screening for other disorder: Secondary | ICD-10-CM | POA: Diagnosis not present

## 2018-11-24 DIAGNOSIS — Z0001 Encounter for general adult medical examination with abnormal findings: Secondary | ICD-10-CM | POA: Diagnosis not present

## 2018-11-24 DIAGNOSIS — R7301 Impaired fasting glucose: Secondary | ICD-10-CM | POA: Diagnosis not present

## 2019-05-21 DIAGNOSIS — Z23 Encounter for immunization: Secondary | ICD-10-CM | POA: Diagnosis not present

## 2019-05-21 DIAGNOSIS — E785 Hyperlipidemia, unspecified: Secondary | ICD-10-CM | POA: Diagnosis not present

## 2019-05-21 DIAGNOSIS — R69 Illness, unspecified: Secondary | ICD-10-CM | POA: Diagnosis not present

## 2019-05-21 DIAGNOSIS — R030 Elevated blood-pressure reading, without diagnosis of hypertension: Secondary | ICD-10-CM | POA: Diagnosis not present

## 2019-05-21 DIAGNOSIS — R7301 Impaired fasting glucose: Secondary | ICD-10-CM | POA: Diagnosis not present

## 2019-06-19 DIAGNOSIS — R69 Illness, unspecified: Secondary | ICD-10-CM | POA: Diagnosis not present

## 2019-06-19 DIAGNOSIS — I10 Essential (primary) hypertension: Secondary | ICD-10-CM | POA: Diagnosis not present

## 2019-07-16 IMAGING — CR DG ABDOMEN ACUTE W/ 1V CHEST
3 series · 3 of 3 positions shown · non-contrast
Comparison: 11/24/2017

CLINICAL DATA: Nausea and vomiting

EXAM:
DG ABDOMEN ACUTE W/ 1V CHEST

[w chest pa]
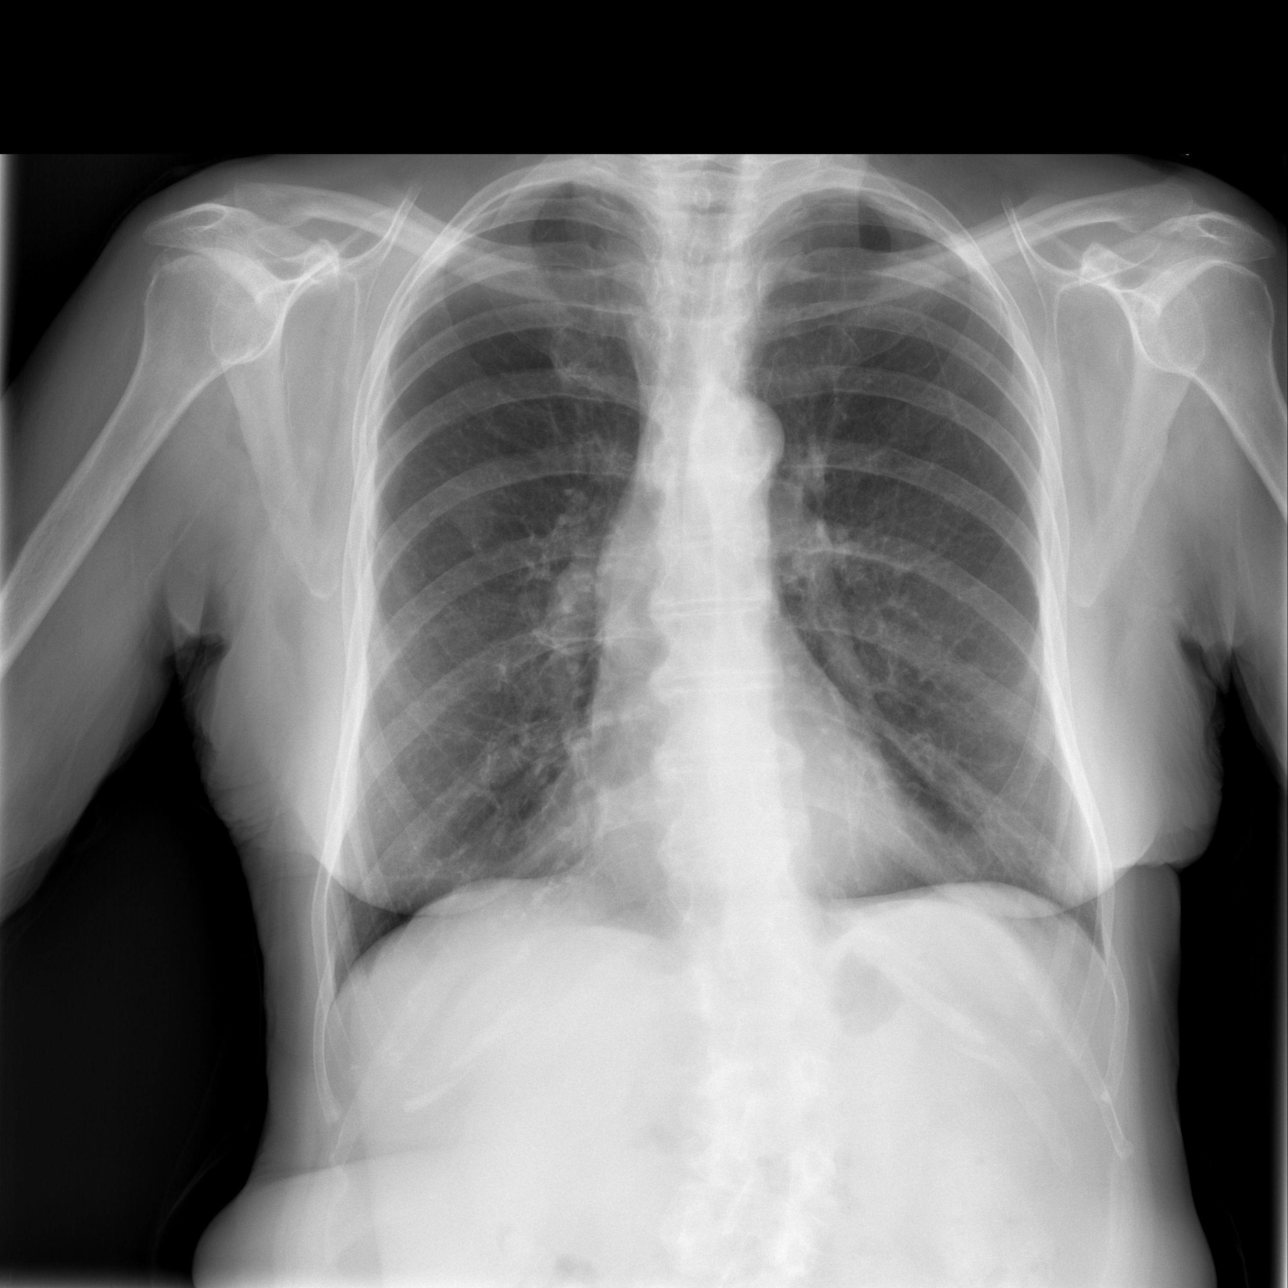

[w abdomen upright *]
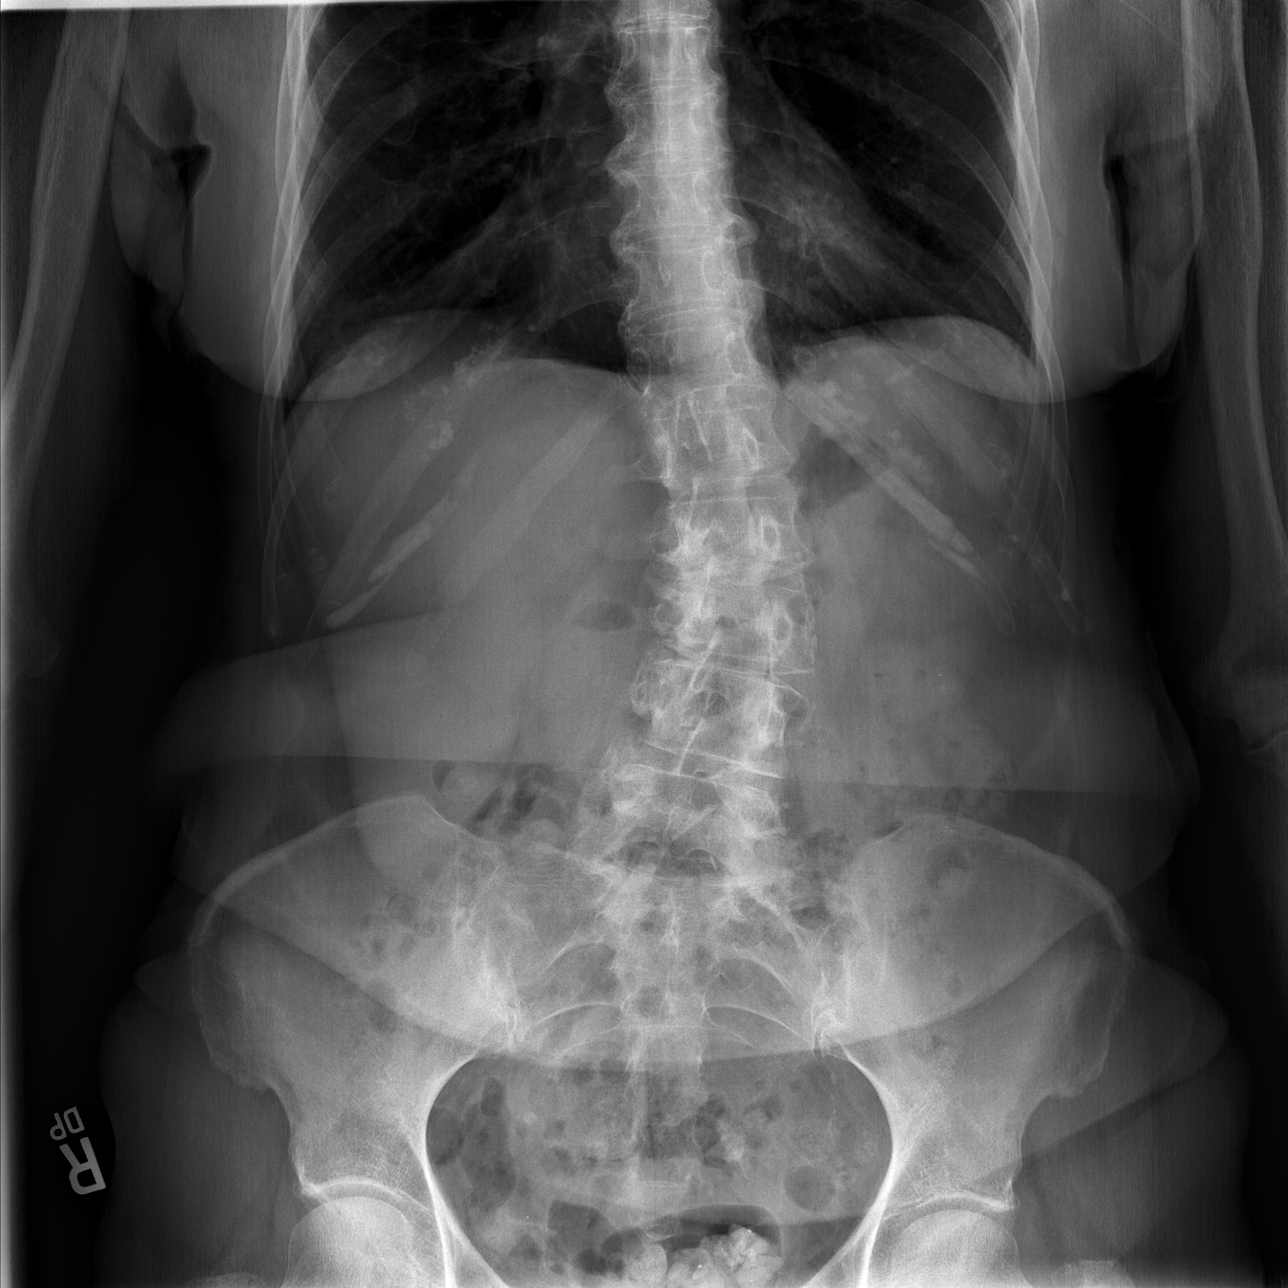

[t abdomen supine]
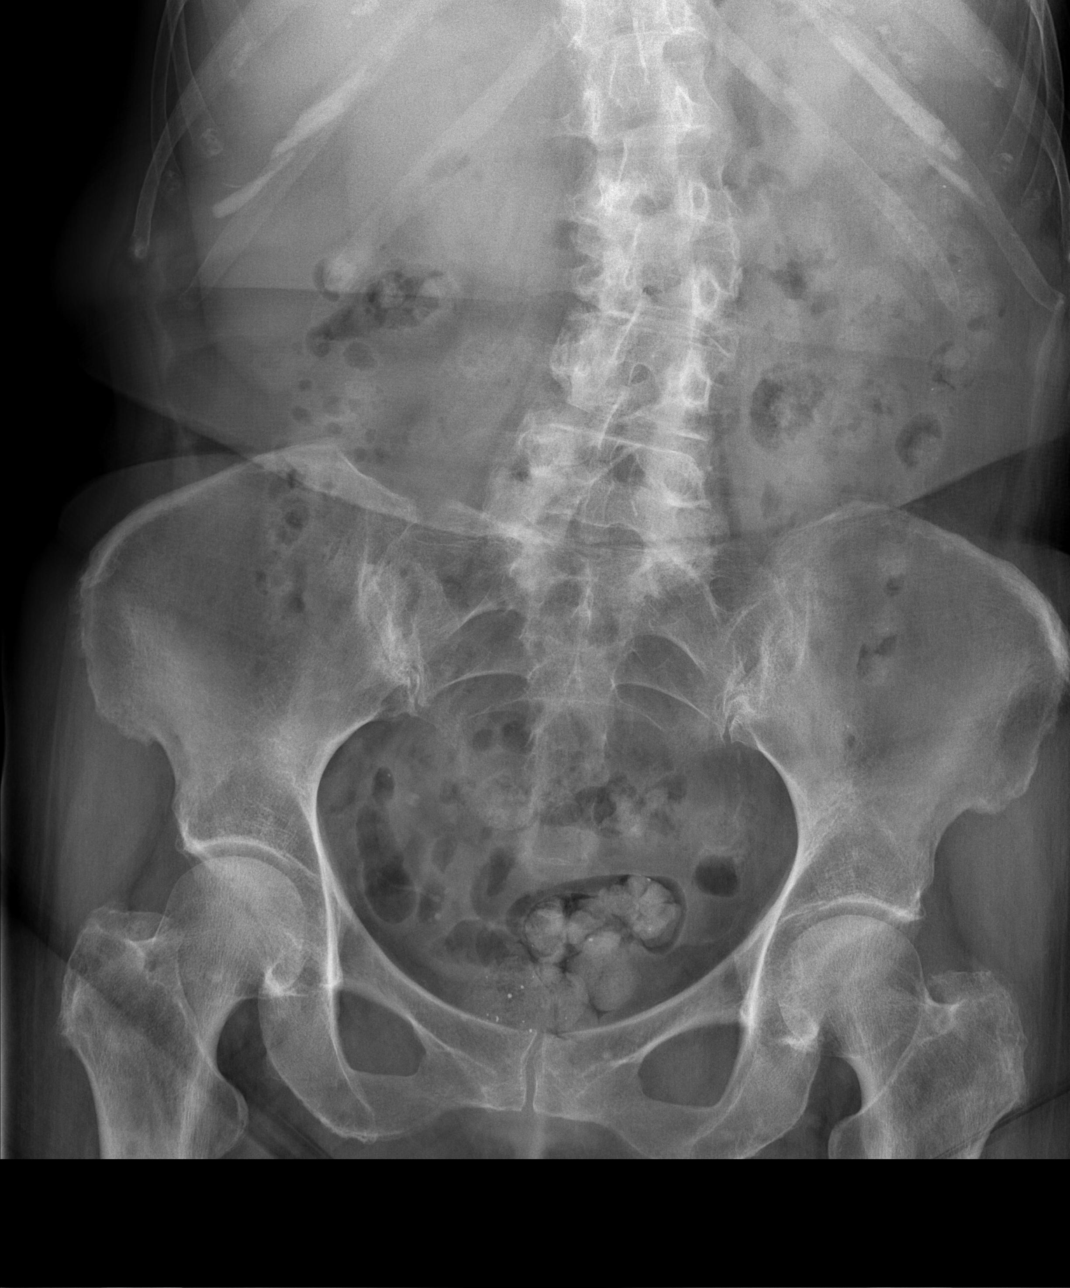

[3 of 3 positions shown; findings below may reference images not displayed]

FINDINGS: Single view chest demonstrates no acute opacity or pleural effusion.
Normal cardiomediastinal silhouette with aortic atherosclerosis. No
pneumothorax. Old right fifth rib fracture. Mild scoliosis of the
spine

Supine and upright views of the abdomen demonstrate no free air
beneath the diaphragm. Nonobstructed bowel-gas pattern with moderate
stool in the colon. Small radiopaque material in the colon. No
radiopaque calculi.
IMPRESSION: 1. No radiographic evidence for acute cardiopulmonary abnormality.
2. Nonobstructed gas pattern with moderate stool in the colon.

## 2019-08-15 DIAGNOSIS — E785 Hyperlipidemia, unspecified: Secondary | ICD-10-CM | POA: Diagnosis not present

## 2019-08-15 DIAGNOSIS — R7301 Impaired fasting glucose: Secondary | ICD-10-CM | POA: Diagnosis not present

## 2019-08-15 DIAGNOSIS — I700 Atherosclerosis of aorta: Secondary | ICD-10-CM | POA: Diagnosis not present

## 2019-08-15 DIAGNOSIS — I10 Essential (primary) hypertension: Secondary | ICD-10-CM | POA: Diagnosis not present

## 2019-08-15 DIAGNOSIS — R69 Illness, unspecified: Secondary | ICD-10-CM | POA: Diagnosis not present

## 2019-08-15 DIAGNOSIS — R911 Solitary pulmonary nodule: Secondary | ICD-10-CM | POA: Diagnosis not present

## 2019-08-20 ENCOUNTER — Other Ambulatory Visit: Payer: Self-pay | Admitting: Internal Medicine

## 2019-08-20 DIAGNOSIS — R911 Solitary pulmonary nodule: Secondary | ICD-10-CM

## 2019-08-30 ENCOUNTER — Other Ambulatory Visit: Payer: Self-pay

## 2019-08-30 ENCOUNTER — Ambulatory Visit
Admission: RE | Admit: 2019-08-30 | Discharge: 2019-08-30 | Disposition: A | Discharge status: Home / Self Care | Payer: Medicare HMO | Source: Ambulatory Visit | Attending: Internal Medicine | Admitting: Internal Medicine

## 2019-08-30 DIAGNOSIS — R911 Solitary pulmonary nodule: Secondary | ICD-10-CM

## 2019-08-30 DIAGNOSIS — J439 Emphysema, unspecified: Secondary | ICD-10-CM | POA: Diagnosis not present

## 2019-12-03 DIAGNOSIS — Z716 Tobacco abuse counseling: Secondary | ICD-10-CM | POA: Diagnosis not present

## 2019-12-03 DIAGNOSIS — I700 Atherosclerosis of aorta: Secondary | ICD-10-CM | POA: Diagnosis not present

## 2019-12-03 DIAGNOSIS — R7301 Impaired fasting glucose: Secondary | ICD-10-CM | POA: Diagnosis not present

## 2019-12-03 DIAGNOSIS — E785 Hyperlipidemia, unspecified: Secondary | ICD-10-CM | POA: Diagnosis not present

## 2019-12-03 DIAGNOSIS — R911 Solitary pulmonary nodule: Secondary | ICD-10-CM | POA: Diagnosis not present

## 2019-12-03 DIAGNOSIS — Z0000 Encounter for general adult medical examination without abnormal findings: Secondary | ICD-10-CM | POA: Diagnosis not present

## 2019-12-03 DIAGNOSIS — Z1231 Encounter for screening mammogram for malignant neoplasm of breast: Secondary | ICD-10-CM | POA: Diagnosis not present

## 2019-12-03 DIAGNOSIS — R69 Illness, unspecified: Secondary | ICD-10-CM | POA: Diagnosis not present

## 2019-12-03 DIAGNOSIS — I10 Essential (primary) hypertension: Secondary | ICD-10-CM | POA: Diagnosis not present

## 2019-12-04 DIAGNOSIS — D225 Melanocytic nevi of trunk: Secondary | ICD-10-CM | POA: Diagnosis not present

## 2019-12-04 DIAGNOSIS — D1801 Hemangioma of skin and subcutaneous tissue: Secondary | ICD-10-CM | POA: Diagnosis not present

## 2019-12-04 DIAGNOSIS — L821 Other seborrheic keratosis: Secondary | ICD-10-CM | POA: Diagnosis not present

## 2019-12-04 DIAGNOSIS — L720 Epidermal cyst: Secondary | ICD-10-CM | POA: Diagnosis not present

## 2019-12-04 DIAGNOSIS — L814 Other melanin hyperpigmentation: Secondary | ICD-10-CM | POA: Diagnosis not present

## 2019-12-17 ENCOUNTER — Other Ambulatory Visit: Payer: Self-pay | Admitting: Internal Medicine

## 2019-12-17 DIAGNOSIS — Z1231 Encounter for screening mammogram for malignant neoplasm of breast: Secondary | ICD-10-CM

## 2019-12-19 DIAGNOSIS — H524 Presbyopia: Secondary | ICD-10-CM | POA: Diagnosis not present

## 2019-12-19 DIAGNOSIS — H52223 Regular astigmatism, bilateral: Secondary | ICD-10-CM | POA: Diagnosis not present

## 2020-01-04 ENCOUNTER — Other Ambulatory Visit: Payer: Self-pay

## 2020-01-04 ENCOUNTER — Ambulatory Visit
Admission: RE | Admit: 2020-01-04 | Discharge: 2020-01-04 | Disposition: A | Discharge status: Home / Self Care | Payer: Medicare HMO | Source: Ambulatory Visit | Attending: Internal Medicine | Admitting: Internal Medicine

## 2020-01-04 DIAGNOSIS — Z1231 Encounter for screening mammogram for malignant neoplasm of breast: Secondary | ICD-10-CM

## 2020-01-21 DIAGNOSIS — H02834 Dermatochalasis of left upper eyelid: Secondary | ICD-10-CM | POA: Diagnosis not present

## 2020-01-21 DIAGNOSIS — Z961 Presence of intraocular lens: Secondary | ICD-10-CM | POA: Diagnosis not present

## 2020-01-21 DIAGNOSIS — H26493 Other secondary cataract, bilateral: Secondary | ICD-10-CM | POA: Diagnosis not present

## 2020-01-21 DIAGNOSIS — H04123 Dry eye syndrome of bilateral lacrimal glands: Secondary | ICD-10-CM | POA: Diagnosis not present

## 2020-01-21 DIAGNOSIS — H02831 Dermatochalasis of right upper eyelid: Secondary | ICD-10-CM | POA: Diagnosis not present

## 2020-02-21 DIAGNOSIS — I10 Essential (primary) hypertension: Secondary | ICD-10-CM | POA: Diagnosis not present

## 2020-02-21 DIAGNOSIS — E785 Hyperlipidemia, unspecified: Secondary | ICD-10-CM | POA: Diagnosis not present

## 2020-02-21 DIAGNOSIS — E782 Mixed hyperlipidemia: Secondary | ICD-10-CM | POA: Diagnosis not present

## 2020-02-21 DIAGNOSIS — R69 Illness, unspecified: Secondary | ICD-10-CM | POA: Diagnosis not present

## 2020-02-21 DIAGNOSIS — G4700 Insomnia, unspecified: Secondary | ICD-10-CM | POA: Diagnosis not present

## 2020-02-21 DIAGNOSIS — J439 Emphysema, unspecified: Secondary | ICD-10-CM | POA: Diagnosis not present

## 2020-03-17 DIAGNOSIS — E782 Mixed hyperlipidemia: Secondary | ICD-10-CM | POA: Diagnosis not present

## 2020-03-17 DIAGNOSIS — F322 Major depressive disorder, single episode, severe without psychotic features: Secondary | ICD-10-CM | POA: Diagnosis not present

## 2020-03-17 DIAGNOSIS — J439 Emphysema, unspecified: Secondary | ICD-10-CM | POA: Diagnosis not present

## 2020-03-17 DIAGNOSIS — F3341 Major depressive disorder, recurrent, in partial remission: Secondary | ICD-10-CM | POA: Diagnosis not present

## 2020-03-17 DIAGNOSIS — G4700 Insomnia, unspecified: Secondary | ICD-10-CM | POA: Diagnosis not present

## 2020-03-17 DIAGNOSIS — I10 Essential (primary) hypertension: Secondary | ICD-10-CM | POA: Diagnosis not present

## 2020-03-17 DIAGNOSIS — E785 Hyperlipidemia, unspecified: Secondary | ICD-10-CM | POA: Diagnosis not present

## 2020-03-17 DIAGNOSIS — R69 Illness, unspecified: Secondary | ICD-10-CM | POA: Diagnosis not present

## 2020-04-09 DIAGNOSIS — J439 Emphysema, unspecified: Secondary | ICD-10-CM | POA: Diagnosis not present

## 2020-04-09 DIAGNOSIS — G4700 Insomnia, unspecified: Secondary | ICD-10-CM | POA: Diagnosis not present

## 2020-04-09 DIAGNOSIS — E785 Hyperlipidemia, unspecified: Secondary | ICD-10-CM | POA: Diagnosis not present

## 2020-04-09 DIAGNOSIS — I10 Essential (primary) hypertension: Secondary | ICD-10-CM | POA: Diagnosis not present

## 2020-04-09 DIAGNOSIS — R69 Illness, unspecified: Secondary | ICD-10-CM | POA: Diagnosis not present

## 2020-04-09 DIAGNOSIS — F322 Major depressive disorder, single episode, severe without psychotic features: Secondary | ICD-10-CM | POA: Diagnosis not present

## 2020-04-09 DIAGNOSIS — F3341 Major depressive disorder, recurrent, in partial remission: Secondary | ICD-10-CM | POA: Diagnosis not present

## 2020-04-09 DIAGNOSIS — E782 Mixed hyperlipidemia: Secondary | ICD-10-CM | POA: Diagnosis not present

## 2020-05-15 DIAGNOSIS — R69 Illness, unspecified: Secondary | ICD-10-CM | POA: Diagnosis not present

## 2020-05-15 DIAGNOSIS — F322 Major depressive disorder, single episode, severe without psychotic features: Secondary | ICD-10-CM | POA: Diagnosis not present

## 2020-05-15 DIAGNOSIS — J439 Emphysema, unspecified: Secondary | ICD-10-CM | POA: Diagnosis not present

## 2020-05-15 DIAGNOSIS — F3341 Major depressive disorder, recurrent, in partial remission: Secondary | ICD-10-CM | POA: Diagnosis not present

## 2020-05-15 DIAGNOSIS — E782 Mixed hyperlipidemia: Secondary | ICD-10-CM | POA: Diagnosis not present

## 2020-05-15 DIAGNOSIS — G4700 Insomnia, unspecified: Secondary | ICD-10-CM | POA: Diagnosis not present

## 2020-05-15 DIAGNOSIS — E785 Hyperlipidemia, unspecified: Secondary | ICD-10-CM | POA: Diagnosis not present

## 2020-05-15 DIAGNOSIS — I10 Essential (primary) hypertension: Secondary | ICD-10-CM | POA: Diagnosis not present

## 2020-05-22 DIAGNOSIS — Z809 Family history of malignant neoplasm, unspecified: Secondary | ICD-10-CM | POA: Diagnosis not present

## 2020-05-22 DIAGNOSIS — G4700 Insomnia, unspecified: Secondary | ICD-10-CM | POA: Diagnosis not present

## 2020-05-22 DIAGNOSIS — J449 Chronic obstructive pulmonary disease, unspecified: Secondary | ICD-10-CM | POA: Diagnosis not present

## 2020-05-22 DIAGNOSIS — R69 Illness, unspecified: Secondary | ICD-10-CM | POA: Diagnosis not present

## 2020-05-22 DIAGNOSIS — I10 Essential (primary) hypertension: Secondary | ICD-10-CM | POA: Diagnosis not present

## 2020-05-22 DIAGNOSIS — Z7982 Long term (current) use of aspirin: Secondary | ICD-10-CM | POA: Diagnosis not present

## 2020-05-22 DIAGNOSIS — L309 Dermatitis, unspecified: Secondary | ICD-10-CM | POA: Diagnosis not present

## 2020-05-22 DIAGNOSIS — E785 Hyperlipidemia, unspecified: Secondary | ICD-10-CM | POA: Diagnosis not present

## 2020-06-04 DIAGNOSIS — J439 Emphysema, unspecified: Secondary | ICD-10-CM | POA: Diagnosis not present

## 2020-06-04 DIAGNOSIS — R69 Illness, unspecified: Secondary | ICD-10-CM | POA: Diagnosis not present

## 2020-06-04 DIAGNOSIS — E782 Mixed hyperlipidemia: Secondary | ICD-10-CM | POA: Diagnosis not present

## 2020-06-04 DIAGNOSIS — L239 Allergic contact dermatitis, unspecified cause: Secondary | ICD-10-CM | POA: Diagnosis not present

## 2020-06-04 DIAGNOSIS — E785 Hyperlipidemia, unspecified: Secondary | ICD-10-CM | POA: Diagnosis not present

## 2020-06-11 ENCOUNTER — Other Ambulatory Visit (HOSPITAL_COMMUNITY): Payer: Self-pay | Admitting: Radiology

## 2020-06-11 DIAGNOSIS — J439 Emphysema, unspecified: Secondary | ICD-10-CM

## 2020-06-18 DIAGNOSIS — R69 Illness, unspecified: Secondary | ICD-10-CM | POA: Diagnosis not present

## 2020-06-18 DIAGNOSIS — G4700 Insomnia, unspecified: Secondary | ICD-10-CM | POA: Diagnosis not present

## 2020-06-18 DIAGNOSIS — J439 Emphysema, unspecified: Secondary | ICD-10-CM | POA: Diagnosis not present

## 2020-06-18 DIAGNOSIS — E785 Hyperlipidemia, unspecified: Secondary | ICD-10-CM | POA: Diagnosis not present

## 2020-06-18 DIAGNOSIS — I10 Essential (primary) hypertension: Secondary | ICD-10-CM | POA: Diagnosis not present

## 2020-06-18 DIAGNOSIS — E782 Mixed hyperlipidemia: Secondary | ICD-10-CM | POA: Diagnosis not present

## 2020-07-17 DIAGNOSIS — J439 Emphysema, unspecified: Secondary | ICD-10-CM | POA: Diagnosis not present

## 2020-07-17 DIAGNOSIS — I10 Essential (primary) hypertension: Secondary | ICD-10-CM | POA: Diagnosis not present

## 2020-07-17 DIAGNOSIS — G4700 Insomnia, unspecified: Secondary | ICD-10-CM | POA: Diagnosis not present

## 2020-07-17 DIAGNOSIS — R69 Illness, unspecified: Secondary | ICD-10-CM | POA: Diagnosis not present

## 2020-07-17 DIAGNOSIS — E785 Hyperlipidemia, unspecified: Secondary | ICD-10-CM | POA: Diagnosis not present

## 2020-07-17 DIAGNOSIS — E782 Mixed hyperlipidemia: Secondary | ICD-10-CM | POA: Diagnosis not present

## 2020-07-21 DIAGNOSIS — R69 Illness, unspecified: Secondary | ICD-10-CM | POA: Diagnosis not present

## 2020-08-15 DIAGNOSIS — J439 Emphysema, unspecified: Secondary | ICD-10-CM | POA: Diagnosis not present

## 2020-08-15 DIAGNOSIS — I10 Essential (primary) hypertension: Secondary | ICD-10-CM | POA: Diagnosis not present

## 2020-08-15 DIAGNOSIS — R69 Illness, unspecified: Secondary | ICD-10-CM | POA: Diagnosis not present

## 2020-08-15 DIAGNOSIS — E782 Mixed hyperlipidemia: Secondary | ICD-10-CM | POA: Diagnosis not present

## 2020-08-15 DIAGNOSIS — E785 Hyperlipidemia, unspecified: Secondary | ICD-10-CM | POA: Diagnosis not present

## 2020-08-15 DIAGNOSIS — G4700 Insomnia, unspecified: Secondary | ICD-10-CM | POA: Diagnosis not present

## 2020-09-03 DIAGNOSIS — R69 Illness, unspecified: Secondary | ICD-10-CM | POA: Diagnosis not present

## 2020-09-03 DIAGNOSIS — J439 Emphysema, unspecified: Secondary | ICD-10-CM | POA: Diagnosis not present

## 2020-09-10 DIAGNOSIS — R69 Illness, unspecified: Secondary | ICD-10-CM | POA: Diagnosis not present

## 2020-09-11 DIAGNOSIS — J439 Emphysema, unspecified: Secondary | ICD-10-CM | POA: Diagnosis not present

## 2020-09-11 DIAGNOSIS — E785 Hyperlipidemia, unspecified: Secondary | ICD-10-CM | POA: Diagnosis not present

## 2020-09-11 DIAGNOSIS — R69 Illness, unspecified: Secondary | ICD-10-CM | POA: Diagnosis not present

## 2020-09-11 DIAGNOSIS — I10 Essential (primary) hypertension: Secondary | ICD-10-CM | POA: Diagnosis not present

## 2020-09-11 DIAGNOSIS — E782 Mixed hyperlipidemia: Secondary | ICD-10-CM | POA: Diagnosis not present

## 2020-09-11 DIAGNOSIS — G4700 Insomnia, unspecified: Secondary | ICD-10-CM | POA: Diagnosis not present

## 2020-10-24 DIAGNOSIS — I10 Essential (primary) hypertension: Secondary | ICD-10-CM | POA: Diagnosis not present

## 2020-10-24 DIAGNOSIS — G4700 Insomnia, unspecified: Secondary | ICD-10-CM | POA: Diagnosis not present

## 2020-10-24 DIAGNOSIS — E785 Hyperlipidemia, unspecified: Secondary | ICD-10-CM | POA: Diagnosis not present

## 2020-10-24 DIAGNOSIS — E782 Mixed hyperlipidemia: Secondary | ICD-10-CM | POA: Diagnosis not present

## 2020-10-24 DIAGNOSIS — J439 Emphysema, unspecified: Secondary | ICD-10-CM | POA: Diagnosis not present

## 2020-10-24 DIAGNOSIS — R69 Illness, unspecified: Secondary | ICD-10-CM | POA: Diagnosis not present

## 2020-11-03 IMAGING — MG DIGITAL SCREENING BILAT W/ TOMO W/ CAD
6 of 10 series · 6 of 30 positions shown · non-contrast
Comparison: Previous exam(s).

CLINICAL DATA: Screening.

EXAM:
DIGITAL SCREENING BILATERAL MAMMOGRAM WITH TOMO AND CAD

[L CC synth-2D]
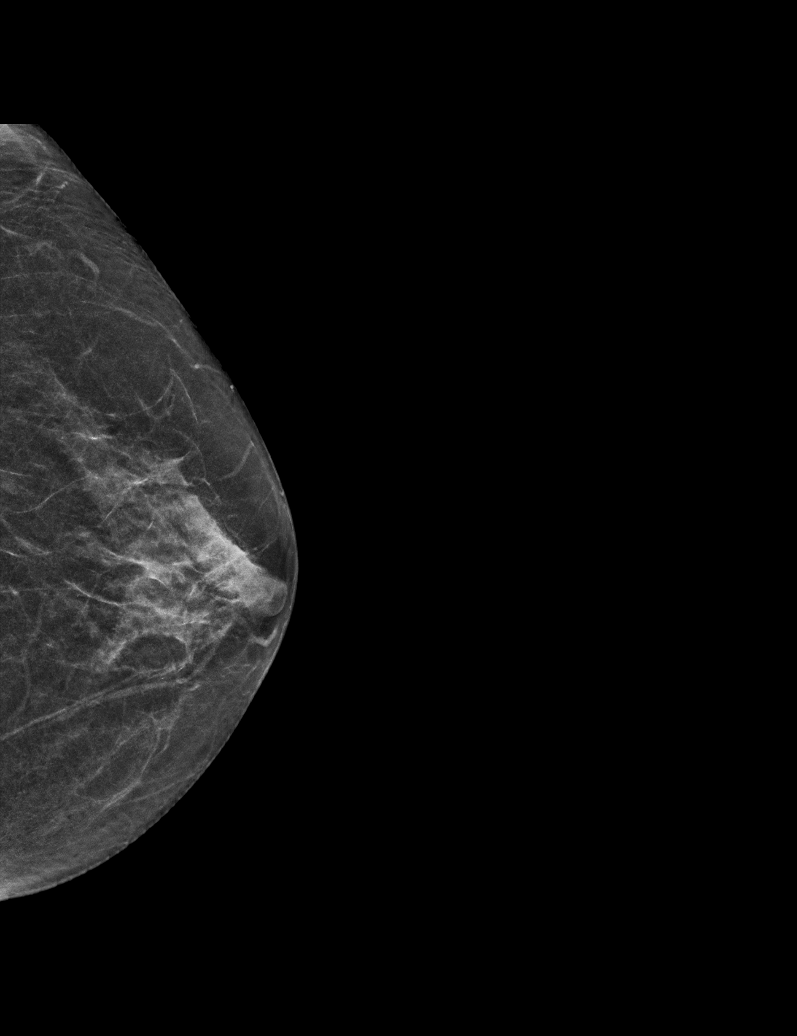

[L MLO synth-2D]
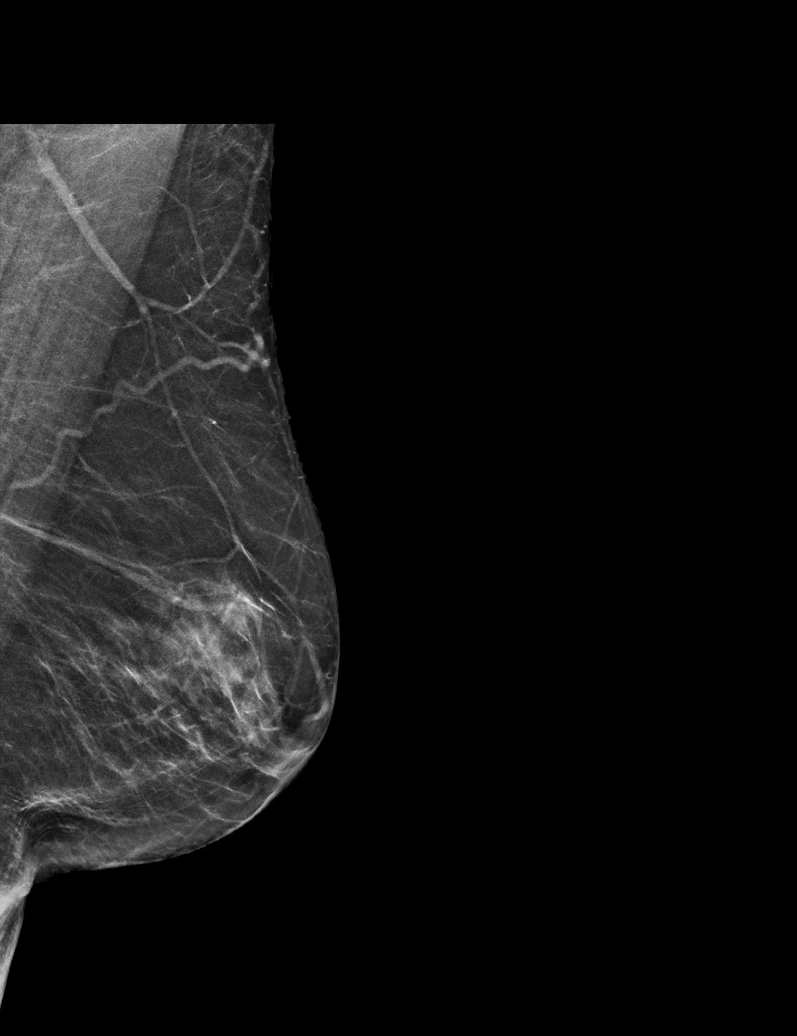

[R CC synth-2D (1 of 2)]
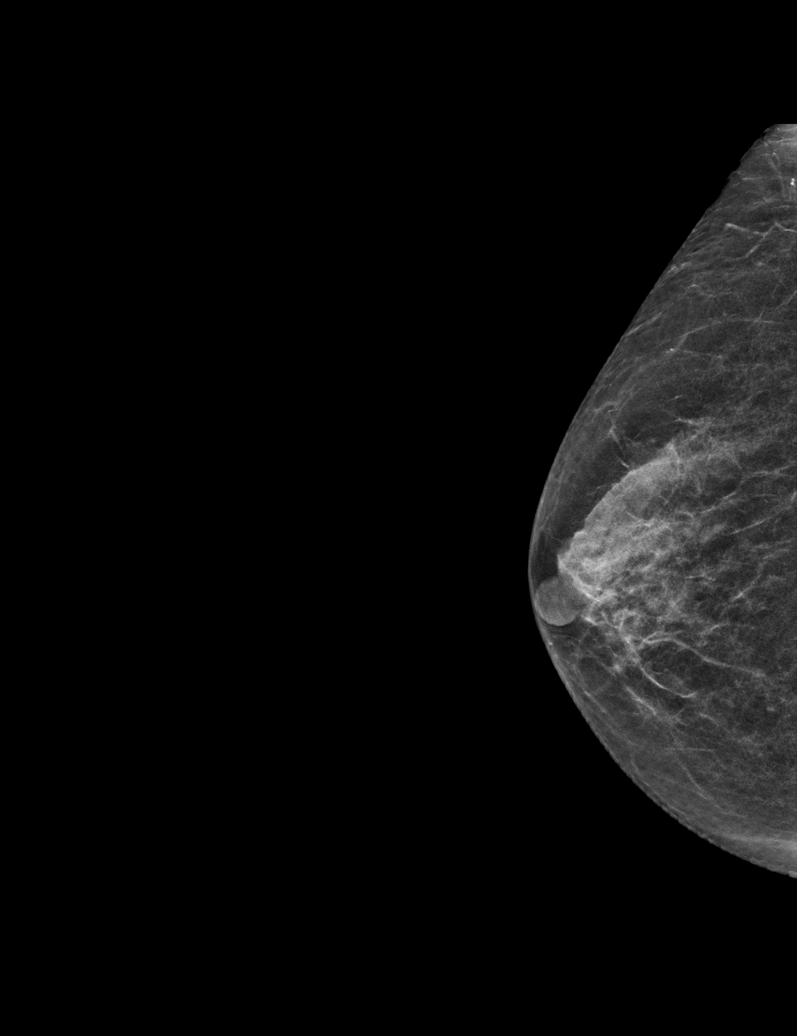

[R CC synth-2D (2 of 2)]
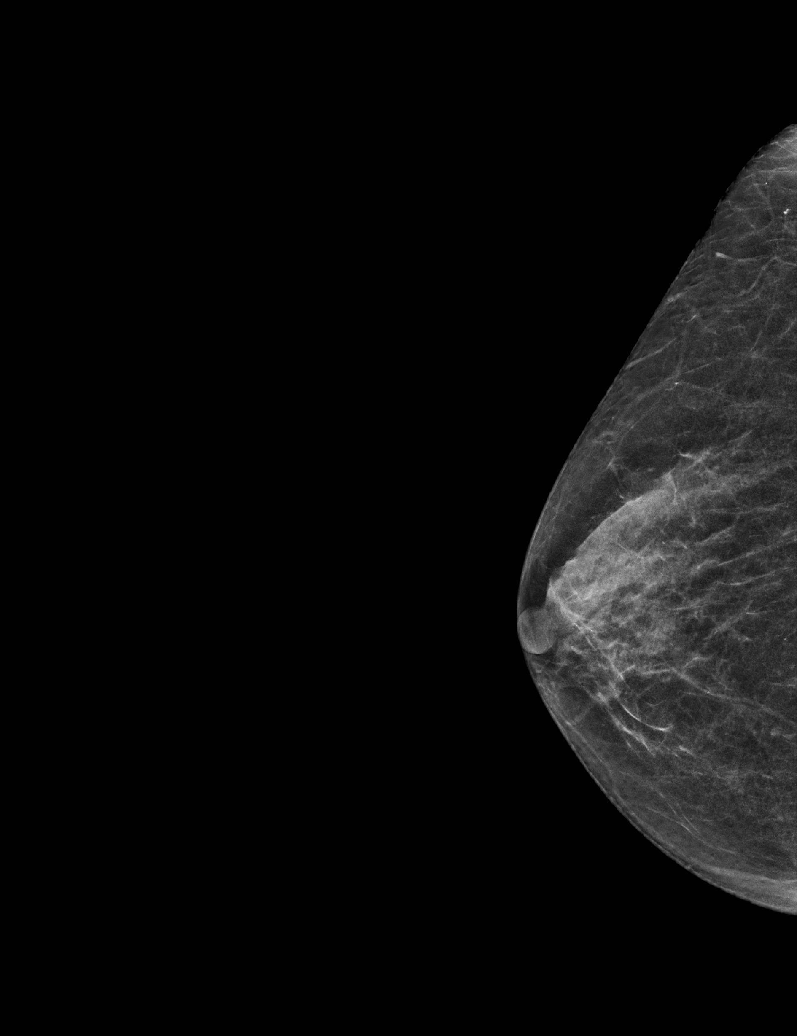

[R MLO synth-2D]
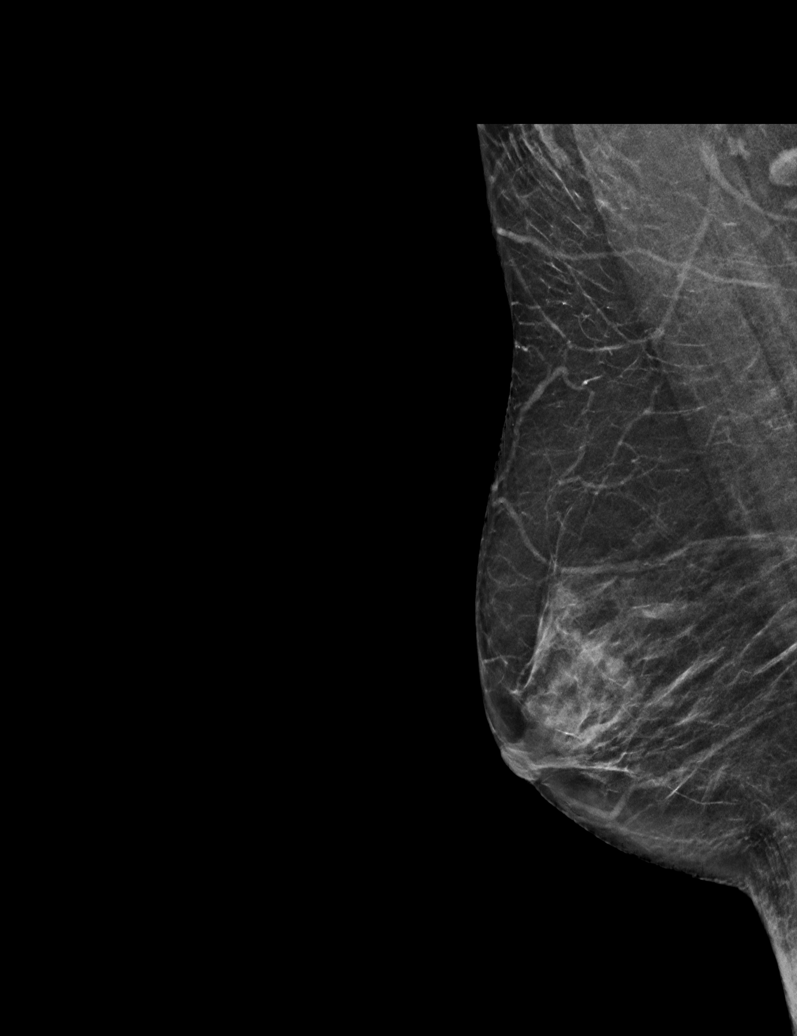

[R CC tomo · tomo slice 22/43.0]
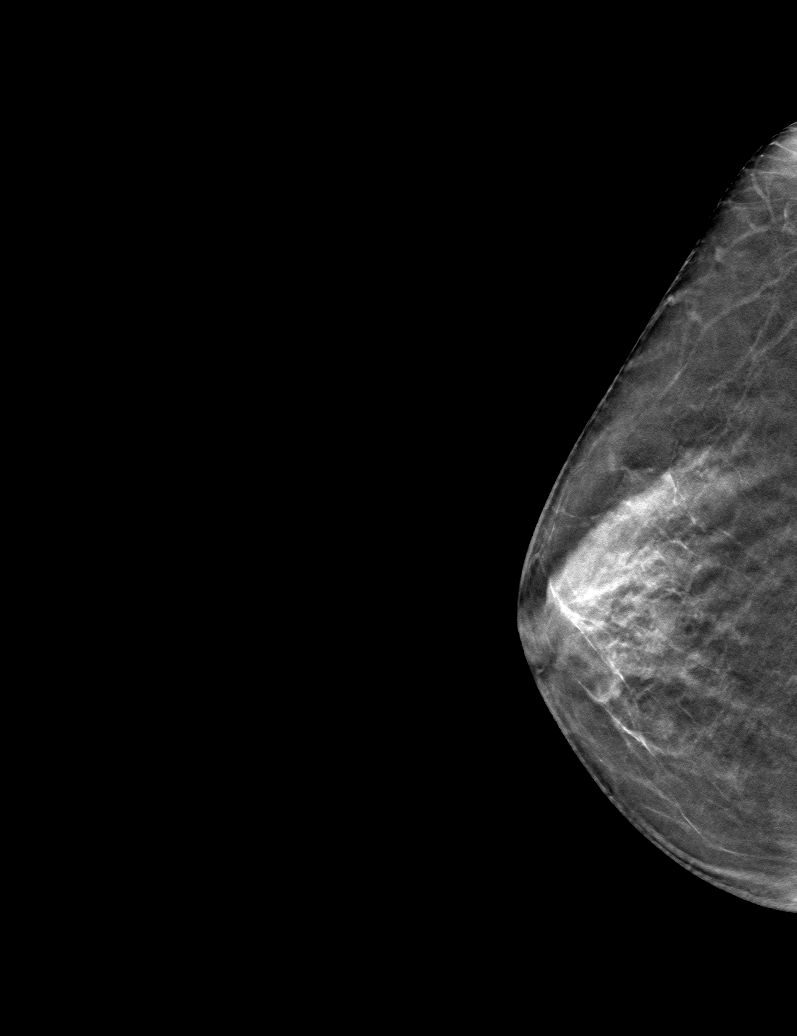

[6 of 30 positions shown; findings below may reference images not displayed]

ACR Breast Density Category c: The breast tissue is heterogeneously
dense, which may obscure small masses.
FINDINGS: There are no findings suspicious for malignancy. Images were
processed with CAD.
IMPRESSION: No mammographic evidence of malignancy. A result letter of this
screening mammogram will be mailed directly to the patient.

RECOMMENDATION:
Screening mammogram in one year. (Code:FT-U-LHB)

BI-RADS CATEGORY  1: Negative.

## 2020-11-07 DIAGNOSIS — E785 Hyperlipidemia, unspecified: Secondary | ICD-10-CM | POA: Diagnosis not present

## 2020-11-07 DIAGNOSIS — R69 Illness, unspecified: Secondary | ICD-10-CM | POA: Diagnosis not present

## 2020-11-07 DIAGNOSIS — J439 Emphysema, unspecified: Secondary | ICD-10-CM | POA: Diagnosis not present

## 2020-11-07 DIAGNOSIS — E782 Mixed hyperlipidemia: Secondary | ICD-10-CM | POA: Diagnosis not present

## 2020-11-07 DIAGNOSIS — I10 Essential (primary) hypertension: Secondary | ICD-10-CM | POA: Diagnosis not present

## 2020-11-07 DIAGNOSIS — G4700 Insomnia, unspecified: Secondary | ICD-10-CM | POA: Diagnosis not present

## 2020-12-03 DIAGNOSIS — J439 Emphysema, unspecified: Secondary | ICD-10-CM | POA: Diagnosis not present

## 2020-12-03 DIAGNOSIS — Z1231 Encounter for screening mammogram for malignant neoplasm of breast: Secondary | ICD-10-CM | POA: Diagnosis not present

## 2020-12-03 DIAGNOSIS — I10 Essential (primary) hypertension: Secondary | ICD-10-CM | POA: Diagnosis not present

## 2020-12-03 DIAGNOSIS — R69 Illness, unspecified: Secondary | ICD-10-CM | POA: Diagnosis not present

## 2020-12-03 DIAGNOSIS — Z23 Encounter for immunization: Secondary | ICD-10-CM | POA: Diagnosis not present

## 2020-12-03 DIAGNOSIS — E785 Hyperlipidemia, unspecified: Secondary | ICD-10-CM | POA: Diagnosis not present

## 2020-12-03 DIAGNOSIS — R911 Solitary pulmonary nodule: Secondary | ICD-10-CM | POA: Diagnosis not present

## 2020-12-03 DIAGNOSIS — Z1211 Encounter for screening for malignant neoplasm of colon: Secondary | ICD-10-CM | POA: Diagnosis not present

## 2020-12-03 DIAGNOSIS — Z0000 Encounter for general adult medical examination without abnormal findings: Secondary | ICD-10-CM | POA: Diagnosis not present

## 2020-12-05 ENCOUNTER — Other Ambulatory Visit: Payer: Self-pay | Admitting: Internal Medicine

## 2020-12-05 DIAGNOSIS — G4700 Insomnia, unspecified: Secondary | ICD-10-CM | POA: Diagnosis not present

## 2020-12-05 DIAGNOSIS — E782 Mixed hyperlipidemia: Secondary | ICD-10-CM | POA: Diagnosis not present

## 2020-12-05 DIAGNOSIS — J439 Emphysema, unspecified: Secondary | ICD-10-CM | POA: Diagnosis not present

## 2020-12-05 DIAGNOSIS — I10 Essential (primary) hypertension: Secondary | ICD-10-CM | POA: Diagnosis not present

## 2020-12-05 DIAGNOSIS — R69 Illness, unspecified: Secondary | ICD-10-CM | POA: Diagnosis not present

## 2020-12-05 DIAGNOSIS — E785 Hyperlipidemia, unspecified: Secondary | ICD-10-CM | POA: Diagnosis not present

## 2020-12-05 DIAGNOSIS — Z1231 Encounter for screening mammogram for malignant neoplasm of breast: Secondary | ICD-10-CM

## 2020-12-11 DIAGNOSIS — L821 Other seborrheic keratosis: Secondary | ICD-10-CM | POA: Diagnosis not present

## 2020-12-11 DIAGNOSIS — L720 Epidermal cyst: Secondary | ICD-10-CM | POA: Diagnosis not present

## 2020-12-11 DIAGNOSIS — D225 Melanocytic nevi of trunk: Secondary | ICD-10-CM | POA: Diagnosis not present

## 2020-12-11 DIAGNOSIS — L814 Other melanin hyperpigmentation: Secondary | ICD-10-CM | POA: Diagnosis not present

## 2020-12-26 ENCOUNTER — Other Ambulatory Visit: Payer: Self-pay | Admitting: Internal Medicine

## 2020-12-26 DIAGNOSIS — R911 Solitary pulmonary nodule: Secondary | ICD-10-CM

## 2021-01-06 DIAGNOSIS — E785 Hyperlipidemia, unspecified: Secondary | ICD-10-CM | POA: Diagnosis not present

## 2021-01-06 DIAGNOSIS — R69 Illness, unspecified: Secondary | ICD-10-CM | POA: Diagnosis not present

## 2021-01-06 DIAGNOSIS — J439 Emphysema, unspecified: Secondary | ICD-10-CM | POA: Diagnosis not present

## 2021-01-06 DIAGNOSIS — I10 Essential (primary) hypertension: Secondary | ICD-10-CM | POA: Diagnosis not present

## 2021-01-06 DIAGNOSIS — G4700 Insomnia, unspecified: Secondary | ICD-10-CM | POA: Diagnosis not present

## 2021-01-06 DIAGNOSIS — E782 Mixed hyperlipidemia: Secondary | ICD-10-CM | POA: Diagnosis not present

## 2021-01-12 ENCOUNTER — Ambulatory Visit
Admission: RE | Admit: 2021-01-12 | Discharge: 2021-01-12 | Disposition: A | Discharge status: Home / Self Care | Payer: Medicare HMO | Source: Ambulatory Visit | Attending: Internal Medicine | Admitting: Internal Medicine

## 2021-01-12 DIAGNOSIS — K449 Diaphragmatic hernia without obstruction or gangrene: Secondary | ICD-10-CM | POA: Diagnosis not present

## 2021-01-12 DIAGNOSIS — J984 Other disorders of lung: Secondary | ICD-10-CM | POA: Diagnosis not present

## 2021-01-12 DIAGNOSIS — J439 Emphysema, unspecified: Secondary | ICD-10-CM | POA: Diagnosis not present

## 2021-01-12 DIAGNOSIS — R911 Solitary pulmonary nodule: Secondary | ICD-10-CM

## 2021-01-12 DIAGNOSIS — I700 Atherosclerosis of aorta: Secondary | ICD-10-CM | POA: Diagnosis not present

## 2021-01-28 ENCOUNTER — Other Ambulatory Visit: Payer: Self-pay

## 2021-01-28 ENCOUNTER — Ambulatory Visit
Admission: RE | Admit: 2021-01-28 | Discharge: 2021-01-28 | Disposition: A | Discharge status: Home / Self Care | Payer: Medicare HMO | Source: Ambulatory Visit | Attending: Internal Medicine | Admitting: Internal Medicine

## 2021-01-28 DIAGNOSIS — Z1231 Encounter for screening mammogram for malignant neoplasm of breast: Secondary | ICD-10-CM | POA: Diagnosis not present

## 2021-02-04 DIAGNOSIS — I10 Essential (primary) hypertension: Secondary | ICD-10-CM | POA: Diagnosis not present

## 2021-02-04 DIAGNOSIS — J439 Emphysema, unspecified: Secondary | ICD-10-CM | POA: Diagnosis not present

## 2021-02-04 DIAGNOSIS — E782 Mixed hyperlipidemia: Secondary | ICD-10-CM | POA: Diagnosis not present

## 2021-02-04 DIAGNOSIS — G4700 Insomnia, unspecified: Secondary | ICD-10-CM | POA: Diagnosis not present

## 2021-02-04 DIAGNOSIS — R69 Illness, unspecified: Secondary | ICD-10-CM | POA: Diagnosis not present

## 2021-02-04 DIAGNOSIS — E785 Hyperlipidemia, unspecified: Secondary | ICD-10-CM | POA: Diagnosis not present

## 2021-03-05 DIAGNOSIS — G4700 Insomnia, unspecified: Secondary | ICD-10-CM | POA: Diagnosis not present

## 2021-03-05 DIAGNOSIS — I10 Essential (primary) hypertension: Secondary | ICD-10-CM | POA: Diagnosis not present

## 2021-03-05 DIAGNOSIS — E782 Mixed hyperlipidemia: Secondary | ICD-10-CM | POA: Diagnosis not present

## 2021-03-05 DIAGNOSIS — R69 Illness, unspecified: Secondary | ICD-10-CM | POA: Diagnosis not present

## 2021-03-05 DIAGNOSIS — J439 Emphysema, unspecified: Secondary | ICD-10-CM | POA: Diagnosis not present

## 2021-03-05 DIAGNOSIS — E785 Hyperlipidemia, unspecified: Secondary | ICD-10-CM | POA: Diagnosis not present

## 2021-03-23 DIAGNOSIS — H04123 Dry eye syndrome of bilateral lacrimal glands: Secondary | ICD-10-CM | POA: Diagnosis not present

## 2021-03-23 DIAGNOSIS — H26491 Other secondary cataract, right eye: Secondary | ICD-10-CM | POA: Diagnosis not present

## 2021-03-23 DIAGNOSIS — H02831 Dermatochalasis of right upper eyelid: Secondary | ICD-10-CM | POA: Diagnosis not present

## 2021-03-23 DIAGNOSIS — Z961 Presence of intraocular lens: Secondary | ICD-10-CM | POA: Diagnosis not present

## 2021-04-02 DIAGNOSIS — E782 Mixed hyperlipidemia: Secondary | ICD-10-CM | POA: Diagnosis not present

## 2021-04-02 DIAGNOSIS — J439 Emphysema, unspecified: Secondary | ICD-10-CM | POA: Diagnosis not present

## 2021-04-02 DIAGNOSIS — I10 Essential (primary) hypertension: Secondary | ICD-10-CM | POA: Diagnosis not present

## 2021-04-02 DIAGNOSIS — E785 Hyperlipidemia, unspecified: Secondary | ICD-10-CM | POA: Diagnosis not present

## 2021-04-02 DIAGNOSIS — R69 Illness, unspecified: Secondary | ICD-10-CM | POA: Diagnosis not present

## 2021-04-02 DIAGNOSIS — G4700 Insomnia, unspecified: Secondary | ICD-10-CM | POA: Diagnosis not present

## 2021-05-01 DIAGNOSIS — G4700 Insomnia, unspecified: Secondary | ICD-10-CM | POA: Diagnosis not present

## 2021-05-01 DIAGNOSIS — E782 Mixed hyperlipidemia: Secondary | ICD-10-CM | POA: Diagnosis not present

## 2021-05-01 DIAGNOSIS — E785 Hyperlipidemia, unspecified: Secondary | ICD-10-CM | POA: Diagnosis not present

## 2021-05-01 DIAGNOSIS — I10 Essential (primary) hypertension: Secondary | ICD-10-CM | POA: Diagnosis not present

## 2021-05-01 DIAGNOSIS — J439 Emphysema, unspecified: Secondary | ICD-10-CM | POA: Diagnosis not present

## 2021-05-01 DIAGNOSIS — R69 Illness, unspecified: Secondary | ICD-10-CM | POA: Diagnosis not present

## 2021-06-03 DIAGNOSIS — J439 Emphysema, unspecified: Secondary | ICD-10-CM | POA: Diagnosis not present

## 2021-06-03 DIAGNOSIS — E782 Mixed hyperlipidemia: Secondary | ICD-10-CM | POA: Diagnosis not present

## 2021-06-03 DIAGNOSIS — R69 Illness, unspecified: Secondary | ICD-10-CM | POA: Diagnosis not present

## 2021-06-03 DIAGNOSIS — G4700 Insomnia, unspecified: Secondary | ICD-10-CM | POA: Diagnosis not present

## 2021-06-03 DIAGNOSIS — I10 Essential (primary) hypertension: Secondary | ICD-10-CM | POA: Diagnosis not present

## 2021-06-03 DIAGNOSIS — E785 Hyperlipidemia, unspecified: Secondary | ICD-10-CM | POA: Diagnosis not present

## 2021-06-09 DIAGNOSIS — R69 Illness, unspecified: Secondary | ICD-10-CM | POA: Diagnosis not present

## 2021-06-09 DIAGNOSIS — F3341 Major depressive disorder, recurrent, in partial remission: Secondary | ICD-10-CM | POA: Diagnosis not present

## 2021-06-09 DIAGNOSIS — G4700 Insomnia, unspecified: Secondary | ICD-10-CM | POA: Diagnosis not present

## 2021-06-09 DIAGNOSIS — F17200 Nicotine dependence, unspecified, uncomplicated: Secondary | ICD-10-CM | POA: Diagnosis not present

## 2021-06-09 DIAGNOSIS — Z23 Encounter for immunization: Secondary | ICD-10-CM | POA: Diagnosis not present

## 2021-07-14 DIAGNOSIS — F411 Generalized anxiety disorder: Secondary | ICD-10-CM | POA: Diagnosis not present

## 2021-07-14 DIAGNOSIS — I10 Essential (primary) hypertension: Secondary | ICD-10-CM | POA: Diagnosis not present

## 2021-07-14 DIAGNOSIS — Z8249 Family history of ischemic heart disease and other diseases of the circulatory system: Secondary | ICD-10-CM | POA: Diagnosis not present

## 2021-07-14 DIAGNOSIS — F3341 Major depressive disorder, recurrent, in partial remission: Secondary | ICD-10-CM | POA: Diagnosis not present

## 2021-07-14 DIAGNOSIS — R69 Illness, unspecified: Secondary | ICD-10-CM | POA: Diagnosis not present

## 2021-07-14 DIAGNOSIS — J439 Emphysema, unspecified: Secondary | ICD-10-CM | POA: Diagnosis not present

## 2021-07-14 DIAGNOSIS — Z91018 Allergy to other foods: Secondary | ICD-10-CM | POA: Diagnosis not present

## 2021-07-14 DIAGNOSIS — Z7982 Long term (current) use of aspirin: Secondary | ICD-10-CM | POA: Diagnosis not present

## 2021-07-14 DIAGNOSIS — G4700 Insomnia, unspecified: Secondary | ICD-10-CM | POA: Diagnosis not present

## 2021-07-14 DIAGNOSIS — E782 Mixed hyperlipidemia: Secondary | ICD-10-CM | POA: Diagnosis not present

## 2021-07-14 DIAGNOSIS — Z882 Allergy status to sulfonamides status: Secondary | ICD-10-CM | POA: Diagnosis not present

## 2021-07-14 DIAGNOSIS — E785 Hyperlipidemia, unspecified: Secondary | ICD-10-CM | POA: Diagnosis not present

## 2021-07-14 DIAGNOSIS — Z809 Family history of malignant neoplasm, unspecified: Secondary | ICD-10-CM | POA: Diagnosis not present

## 2021-07-14 DIAGNOSIS — F17210 Nicotine dependence, cigarettes, uncomplicated: Secondary | ICD-10-CM | POA: Diagnosis not present

## 2021-07-30 DIAGNOSIS — E782 Mixed hyperlipidemia: Secondary | ICD-10-CM | POA: Diagnosis not present

## 2021-07-30 DIAGNOSIS — I10 Essential (primary) hypertension: Secondary | ICD-10-CM | POA: Diagnosis not present

## 2021-07-30 DIAGNOSIS — E785 Hyperlipidemia, unspecified: Secondary | ICD-10-CM | POA: Diagnosis not present

## 2021-07-30 DIAGNOSIS — G4700 Insomnia, unspecified: Secondary | ICD-10-CM | POA: Diagnosis not present

## 2021-07-30 DIAGNOSIS — R69 Illness, unspecified: Secondary | ICD-10-CM | POA: Diagnosis not present

## 2021-07-30 DIAGNOSIS — J439 Emphysema, unspecified: Secondary | ICD-10-CM | POA: Diagnosis not present

## 2021-08-28 DIAGNOSIS — G4700 Insomnia, unspecified: Secondary | ICD-10-CM | POA: Diagnosis not present

## 2021-08-28 DIAGNOSIS — E782 Mixed hyperlipidemia: Secondary | ICD-10-CM | POA: Diagnosis not present

## 2021-08-28 DIAGNOSIS — J439 Emphysema, unspecified: Secondary | ICD-10-CM | POA: Diagnosis not present

## 2021-08-28 DIAGNOSIS — R69 Illness, unspecified: Secondary | ICD-10-CM | POA: Diagnosis not present

## 2021-08-28 DIAGNOSIS — E785 Hyperlipidemia, unspecified: Secondary | ICD-10-CM | POA: Diagnosis not present

## 2021-08-28 DIAGNOSIS — I10 Essential (primary) hypertension: Secondary | ICD-10-CM | POA: Diagnosis not present

## 2021-09-01 DIAGNOSIS — R69 Illness, unspecified: Secondary | ICD-10-CM | POA: Diagnosis not present

## 2021-09-01 DIAGNOSIS — J439 Emphysema, unspecified: Secondary | ICD-10-CM | POA: Diagnosis not present

## 2021-09-01 DIAGNOSIS — I10 Essential (primary) hypertension: Secondary | ICD-10-CM | POA: Diagnosis not present

## 2021-10-27 DIAGNOSIS — E782 Mixed hyperlipidemia: Secondary | ICD-10-CM | POA: Diagnosis not present

## 2021-10-27 DIAGNOSIS — I10 Essential (primary) hypertension: Secondary | ICD-10-CM | POA: Diagnosis not present

## 2021-10-27 DIAGNOSIS — G4700 Insomnia, unspecified: Secondary | ICD-10-CM | POA: Diagnosis not present

## 2021-10-27 DIAGNOSIS — J439 Emphysema, unspecified: Secondary | ICD-10-CM | POA: Diagnosis not present

## 2021-10-27 DIAGNOSIS — R69 Illness, unspecified: Secondary | ICD-10-CM | POA: Diagnosis not present

## 2021-12-22 DIAGNOSIS — L814 Other melanin hyperpigmentation: Secondary | ICD-10-CM | POA: Diagnosis not present

## 2021-12-22 DIAGNOSIS — L821 Other seborrheic keratosis: Secondary | ICD-10-CM | POA: Diagnosis not present

## 2021-12-22 DIAGNOSIS — L84 Corns and callosities: Secondary | ICD-10-CM | POA: Diagnosis not present

## 2021-12-22 DIAGNOSIS — L817 Pigmented purpuric dermatosis: Secondary | ICD-10-CM | POA: Diagnosis not present

## 2021-12-22 DIAGNOSIS — L728 Other follicular cysts of the skin and subcutaneous tissue: Secondary | ICD-10-CM | POA: Diagnosis not present

## 2021-12-22 DIAGNOSIS — D225 Melanocytic nevi of trunk: Secondary | ICD-10-CM | POA: Diagnosis not present

## 2021-12-22 DIAGNOSIS — L720 Epidermal cyst: Secondary | ICD-10-CM | POA: Diagnosis not present

## 2021-12-29 DIAGNOSIS — R69 Illness, unspecified: Secondary | ICD-10-CM | POA: Diagnosis not present

## 2021-12-29 DIAGNOSIS — I10 Essential (primary) hypertension: Secondary | ICD-10-CM | POA: Diagnosis not present

## 2021-12-29 DIAGNOSIS — E785 Hyperlipidemia, unspecified: Secondary | ICD-10-CM | POA: Diagnosis not present

## 2022-01-04 ENCOUNTER — Other Ambulatory Visit: Payer: Self-pay | Admitting: Internal Medicine

## 2022-01-04 DIAGNOSIS — Z1231 Encounter for screening mammogram for malignant neoplasm of breast: Secondary | ICD-10-CM

## 2022-01-05 DIAGNOSIS — Z23 Encounter for immunization: Secondary | ICD-10-CM | POA: Diagnosis not present

## 2022-01-05 DIAGNOSIS — R7309 Other abnormal glucose: Secondary | ICD-10-CM | POA: Diagnosis not present

## 2022-01-05 DIAGNOSIS — E785 Hyperlipidemia, unspecified: Secondary | ICD-10-CM | POA: Diagnosis not present

## 2022-01-05 DIAGNOSIS — L723 Sebaceous cyst: Secondary | ICD-10-CM | POA: Diagnosis not present

## 2022-01-05 DIAGNOSIS — Z0000 Encounter for general adult medical examination without abnormal findings: Secondary | ICD-10-CM | POA: Diagnosis not present

## 2022-01-05 DIAGNOSIS — R69 Illness, unspecified: Secondary | ICD-10-CM | POA: Diagnosis not present

## 2022-01-05 DIAGNOSIS — R7301 Impaired fasting glucose: Secondary | ICD-10-CM | POA: Diagnosis not present

## 2022-01-05 DIAGNOSIS — R350 Frequency of micturition: Secondary | ICD-10-CM | POA: Diagnosis not present

## 2022-01-05 DIAGNOSIS — I10 Essential (primary) hypertension: Secondary | ICD-10-CM | POA: Diagnosis not present

## 2022-01-05 DIAGNOSIS — I700 Atherosclerosis of aorta: Secondary | ICD-10-CM | POA: Diagnosis not present

## 2022-01-05 DIAGNOSIS — J439 Emphysema, unspecified: Secondary | ICD-10-CM | POA: Diagnosis not present

## 2022-01-29 ENCOUNTER — Ambulatory Visit
Admission: RE | Admit: 2022-01-29 | Discharge: 2022-01-29 | Disposition: A | Discharge status: Home / Self Care | Payer: Medicare HMO | Source: Ambulatory Visit | Attending: Internal Medicine | Admitting: Internal Medicine

## 2022-01-29 DIAGNOSIS — Z1231 Encounter for screening mammogram for malignant neoplasm of breast: Secondary | ICD-10-CM | POA: Diagnosis not present

## 2022-02-24 DIAGNOSIS — R69 Illness, unspecified: Secondary | ICD-10-CM | POA: Diagnosis not present

## 2022-02-24 DIAGNOSIS — I10 Essential (primary) hypertension: Secondary | ICD-10-CM | POA: Diagnosis not present

## 2022-02-24 DIAGNOSIS — E785 Hyperlipidemia, unspecified: Secondary | ICD-10-CM | POA: Diagnosis not present

## 2022-03-25 DIAGNOSIS — D485 Neoplasm of uncertain behavior of skin: Secondary | ICD-10-CM | POA: Diagnosis not present

## 2022-03-26 DIAGNOSIS — G4700 Insomnia, unspecified: Secondary | ICD-10-CM | POA: Diagnosis not present

## 2022-03-26 DIAGNOSIS — E785 Hyperlipidemia, unspecified: Secondary | ICD-10-CM | POA: Diagnosis not present

## 2022-03-26 DIAGNOSIS — I10 Essential (primary) hypertension: Secondary | ICD-10-CM | POA: Diagnosis not present

## 2022-03-26 DIAGNOSIS — R69 Illness, unspecified: Secondary | ICD-10-CM | POA: Diagnosis not present

## 2022-05-19 DIAGNOSIS — H26491 Other secondary cataract, right eye: Secondary | ICD-10-CM | POA: Diagnosis not present

## 2022-05-19 DIAGNOSIS — H02831 Dermatochalasis of right upper eyelid: Secondary | ICD-10-CM | POA: Diagnosis not present

## 2022-05-19 DIAGNOSIS — H04123 Dry eye syndrome of bilateral lacrimal glands: Secondary | ICD-10-CM | POA: Diagnosis not present

## 2022-05-19 DIAGNOSIS — Z961 Presence of intraocular lens: Secondary | ICD-10-CM | POA: Diagnosis not present

## 2022-07-06 DIAGNOSIS — Z23 Encounter for immunization: Secondary | ICD-10-CM | POA: Diagnosis not present

## 2022-07-06 DIAGNOSIS — R69 Illness, unspecified: Secondary | ICD-10-CM | POA: Diagnosis not present

## 2022-07-06 DIAGNOSIS — J439 Emphysema, unspecified: Secondary | ICD-10-CM | POA: Diagnosis not present

## 2022-07-06 DIAGNOSIS — I10 Essential (primary) hypertension: Secondary | ICD-10-CM | POA: Diagnosis not present

## 2022-07-06 DIAGNOSIS — R32 Unspecified urinary incontinence: Secondary | ICD-10-CM | POA: Diagnosis not present

## 2022-12-23 DIAGNOSIS — L814 Other melanin hyperpigmentation: Secondary | ICD-10-CM | POA: Diagnosis not present

## 2022-12-23 DIAGNOSIS — L817 Pigmented purpuric dermatosis: Secondary | ICD-10-CM | POA: Diagnosis not present

## 2022-12-23 DIAGNOSIS — D225 Melanocytic nevi of trunk: Secondary | ICD-10-CM | POA: Diagnosis not present

## 2022-12-23 DIAGNOSIS — D2271 Melanocytic nevi of right lower limb, including hip: Secondary | ICD-10-CM | POA: Diagnosis not present

## 2022-12-23 DIAGNOSIS — L821 Other seborrheic keratosis: Secondary | ICD-10-CM | POA: Diagnosis not present

## 2022-12-23 DIAGNOSIS — L720 Epidermal cyst: Secondary | ICD-10-CM | POA: Diagnosis not present

## 2022-12-31 ENCOUNTER — Other Ambulatory Visit: Payer: Self-pay | Admitting: Internal Medicine

## 2022-12-31 DIAGNOSIS — Z1231 Encounter for screening mammogram for malignant neoplasm of breast: Secondary | ICD-10-CM

## 2023-01-07 DIAGNOSIS — I10 Essential (primary) hypertension: Secondary | ICD-10-CM | POA: Diagnosis not present

## 2023-01-07 DIAGNOSIS — E559 Vitamin D deficiency, unspecified: Secondary | ICD-10-CM | POA: Diagnosis not present

## 2023-01-07 DIAGNOSIS — J439 Emphysema, unspecified: Secondary | ICD-10-CM | POA: Diagnosis not present

## 2023-01-07 DIAGNOSIS — Z0000 Encounter for general adult medical examination without abnormal findings: Secondary | ICD-10-CM | POA: Diagnosis not present

## 2023-01-07 DIAGNOSIS — R7309 Other abnormal glucose: Secondary | ICD-10-CM | POA: Diagnosis not present

## 2023-01-07 DIAGNOSIS — E785 Hyperlipidemia, unspecified: Secondary | ICD-10-CM | POA: Diagnosis not present

## 2023-01-07 DIAGNOSIS — Z9181 History of falling: Secondary | ICD-10-CM | POA: Diagnosis not present

## 2023-01-07 DIAGNOSIS — R69 Illness, unspecified: Secondary | ICD-10-CM | POA: Diagnosis not present

## 2023-01-07 DIAGNOSIS — Z1331 Encounter for screening for depression: Secondary | ICD-10-CM | POA: Diagnosis not present

## 2023-01-07 DIAGNOSIS — R7301 Impaired fasting glucose: Secondary | ICD-10-CM | POA: Diagnosis not present

## 2023-01-07 DIAGNOSIS — I700 Atherosclerosis of aorta: Secondary | ICD-10-CM | POA: Diagnosis not present

## 2023-01-07 DIAGNOSIS — E2839 Other primary ovarian failure: Secondary | ICD-10-CM | POA: Diagnosis not present

## 2023-01-13 ENCOUNTER — Other Ambulatory Visit: Payer: Self-pay | Admitting: Internal Medicine

## 2023-01-13 DIAGNOSIS — E2839 Other primary ovarian failure: Secondary | ICD-10-CM

## 2023-02-01 ENCOUNTER — Encounter: Payer: Self-pay | Admitting: Pulmonary Disease

## 2023-02-01 ENCOUNTER — Ambulatory Visit: Payer: Medicare HMO | Admitting: Pulmonary Disease

## 2023-02-01 VITALS — BP 118/70 | HR 98 | Ht 60.0 in | Wt 113.2 lb

## 2023-02-01 DIAGNOSIS — J069 Acute upper respiratory infection, unspecified: Secondary | ICD-10-CM

## 2023-02-01 DIAGNOSIS — J432 Centrilobular emphysema: Secondary | ICD-10-CM | POA: Diagnosis not present

## 2023-02-01 MED ORDER — AZITHROMYCIN 250 MG PO TABS: ORAL_TABLET | ORAL | 0 refills | Status: DC

## 2023-02-01 MED ORDER — PREDNISONE 10 MG PO TABS: 40.0000 mg | ORAL_TABLET | Freq: Every day | ORAL | 0 refills | Status: DC

## 2023-02-01 NOTE — Progress Notes (Signed)
Synopsis: Referred in May 2024 for emphysema by Lorenda Ishihara, MD  Subjective:   PATIENT ID: Sarah Harrell GENDER: female DOB: [birth year], MRN: [id number]  HPI  Chief Complaint  Patient presents with   Consult    Referred for emphysema.    Sarah Harrell is an 82 year old woman, daily smoker who is referred to pulmonary clinic for emphysema.   She is currently have cold like symptoms that started 5 days ago. She is feeling better. She is able to complete activities of daily living and go grocery shopping without issue. No cough or mucous production or wheezing. She has albuterol inhaler which she is using 3-4 times per day over past 5 days, other wise she uses it a couple times per week. She is currently coughing up pale yellow sputum.   She has smoked for 60 years, she smoked 1 pack per day a majority of the time and 2 packs per day for 3-4 years. Currently smoking 7 cigarettes per day. She has tried to quit previously and is using nicotine gum to help quit. She was a [employer] information [employer] manager.   Past Medical History:  Diagnosis Date   Depressive disorder, not elsewhere classified    Elevated fasting glucose    Glucose 110 as of 09/13/13   Fatigue    Guaiac positive stools    Normal colonoscopy 2009   Insomnia, unspecified    Joint pain    Mixed hyperlipidemia    Systemic sclerosis (HCC)    Tobacco dependence    Vitamin D deficiency      Family History  Problem Relation Age of Onset   CAD Mother 70   Hypertension Father    Cancer Brother        pancreatic   Breast cancer Neg Hx      Social History   Socioeconomic History   Marital status: Divorced    Spouse name: Not on file   Number of children: Not on file   Years of education: Not on file   Highest education level: Not on file  Occupational History   Not on file  Tobacco Use   Smoking status: Every Day    Packs/day: .5    Types: Cigarettes   Smokeless tobacco: Never   Tobacco comments:    using nicotine gum  during the day  Substance and Sexual Activity   Alcohol use: Yes    Comment: daily   Drug use: No   Sexual activity: Not on file  Other Topics Concern   Not on file  Social History Narrative   Not on file   Social Determinants of Health   Financial Resource Strain: Not on file  Food Insecurity: Not on file  Transportation Needs: Not on file  Physical Activity: Not on file  Stress: Not on file  Social Connections: Not on file  Intimate Partner Violence: Not on file     Allergies  Allergen Reactions   Caffeine Hives     Outpatient Medications Prior to Visit  Medication Sig Dispense Refill   albuterol (VENTOLIN HFA) 108 (90 Base) MCG/ACT inhaler Inhale 1-2 puffs into the lungs every 6 (six) hours as needed.     amLODipine (NORVASC) 5 MG tablet Take 5 mg by mouth daily.     citalopram (CELEXA) 20 MG tablet Take 20 mg by mouth daily.     ECHINACEA PO      glucosamine-chondroitin 500-400 MG tablet Take 1 tablet by mouth daily.  Multiple Vitamin (MULTIVITAMIN) tablet Take 1 tablet by mouth daily.     rosuvastatin (CRESTOR) 10 MG tablet Take 10 mg by mouth daily.     traZODone (DESYREL) 100 MG tablet Take 50 mg by mouth at bedtime.     No facility-administered medications prior to visit.    Review of Systems  Constitutional:  Negative for chills, fever, malaise/fatigue and weight loss.  HENT:  Negative for congestion, sinus pain and sore throat.   Eyes: Negative.   Respiratory:  Positive for cough, sputum production, shortness of breath and wheezing. Negative for hemoptysis.   Cardiovascular:  Negative for chest pain, palpitations, orthopnea, claudication and leg swelling.  Gastrointestinal:  Negative for abdominal pain, heartburn, nausea and vomiting.  Genitourinary: Negative.   Musculoskeletal:  Negative for joint pain and myalgias.  Skin:  Negative for rash.  Neurological:  Negative for weakness.  Endo/Heme/Allergies: Negative.   Psychiatric/Behavioral: Negative.       Objective:   Vitals:   02/01/23 1311  BP: 118/70  Pulse: 98  SpO2: 92%  Weight: 113 lb 3.2 oz (51.3 kg)  Height: 5' (1.524 m)   Physical Exam Constitutional:      General: She is not in acute distress.    Appearance: She is not ill-appearing.  HENT:     Head: Normocephalic and atraumatic.  Eyes:     General: No scleral icterus.    Conjunctiva/sclera: Conjunctivae normal.     Pupils: Pupils are equal, round, and reactive to light.  Cardiovascular:     Rate and Rhythm: Normal rate and regular rhythm.     Pulses: Normal pulses.     Heart sounds: Normal heart sounds. No murmur heard. Pulmonary:     Effort: Pulmonary effort is normal.     Breath sounds: Decreased air movement present. Wheezing present. No rhonchi or rales.  Abdominal:     General: Bowel sounds are normal.     Palpations: Abdomen is soft.  Musculoskeletal:     Right lower leg: No edema.     Left lower leg: No edema.  Lymphadenopathy:     Cervical: No cervical adenopathy.  Skin:    General: Skin is warm and dry.  Neurological:     General: No focal deficit present.     Mental Status: She is alert.  Psychiatric:        Mood and Affect: Mood normal.        Behavior: Behavior normal.        Thought Content: Thought content normal.        Judgment: Judgment normal.    CBC No results found for: "WBC", "RBC", "HGB", "HCT", "PLT", "MCV", "MCH", "MCHC", "RDW", "LYMPHSABS", "MONOABS", "EOSABS", "BASOSABS"   Chest imaging: CT Chest 01/12/21 Cardiovascular: Atherosclerosis of thoracic aorta is noted without aneurysm formation. Normal cardiac size. No pericardial effusion.   Mediastinum/Nodes: Small sliding-type hiatal hernia is noted. No adenopathy is noted. Thyroid gland is unremarkable.   Lungs/Pleura: No pneumothorax or pleural effusion is noted. Emphysematous disease is noted bilaterally. Mild biapical scarring is noted. Right upper lobe pulmonary nodule noted on prior exam is not visualized  currently. Scarring remains in the right upper lobe.  PFT:     No data to display          Labs:  Path:  Echo:  Heart Catheterization:    Assessment & Plan:   Centrilobular emphysema (HCC)  Upper respiratory tract infection, unspecified type  Discussion: Sarah Harrell is an 82 year old woman, daily  smoker who is referred to pulmonary clinic for emphysema.  She has exacerbation of her COPD due to recent cold. We will treat her with 40mg  of prednisone daily for 5 days and a Zpak.   She is to continue albuterol inhaler as needed. Will consider maintenance inhaler in the future.  Follow up in 2 months with PFTs.  Melody Comas, MD [facility] Office: [phone]    Current Outpatient Medications:    albuterol (VENTOLIN HFA) 108 (90 Base) MCG/ACT inhaler, Inhale 1-2 puffs into the lungs every 6 (six) hours as needed., Disp: , Rfl:    amLODipine (NORVASC) 5 MG tablet, Take 5 mg by mouth daily., Disp: , Rfl:    citalopram (CELEXA) 20 MG tablet, Take 20 mg by mouth daily., Disp: , Rfl:    ECHINACEA PO, , Disp: , Rfl:    glucosamine-chondroitin 500-400 MG tablet, Take 1 tablet by mouth daily., Disp: , Rfl:    Multiple Vitamin (MULTIVITAMIN) tablet, Take 1 tablet by mouth daily., Disp: , Rfl:    rosuvastatin (CRESTOR) 10 MG tablet, Take 10 mg by mouth daily., Disp: , Rfl:    traZODone (DESYREL) 100 MG tablet, Take 50 mg by mouth at bedtime., Disp: , Rfl:

## 2023-02-01 NOTE — Patient Instructions (Addendum)
Start prednisone 40mg  daily for 5 days  Start Zpak antibiotic for 5 days  Continue to use albuterol inhaler 1-2 puffs every 4-6 hours  Follow up in 2 months with pulmonary function tests

## 2023-02-08 ENCOUNTER — Ambulatory Visit
Admission: RE | Admit: 2023-02-08 | Discharge: 2023-02-08 | Disposition: A | Discharge status: Home / Self Care | Payer: Medicare HMO | Source: Ambulatory Visit | Attending: Internal Medicine | Admitting: Internal Medicine

## 2023-02-08 DIAGNOSIS — Z1231 Encounter for screening mammogram for malignant neoplasm of breast: Secondary | ICD-10-CM | POA: Diagnosis not present

## 2023-02-09 DIAGNOSIS — J439 Emphysema, unspecified: Secondary | ICD-10-CM | POA: Diagnosis not present

## 2023-02-09 DIAGNOSIS — N289 Disorder of kidney and ureter, unspecified: Secondary | ICD-10-CM | POA: Diagnosis not present

## 2023-02-09 DIAGNOSIS — J441 Chronic obstructive pulmonary disease with (acute) exacerbation: Secondary | ICD-10-CM | POA: Diagnosis not present

## 2023-03-09 DIAGNOSIS — L080 Pyoderma: Secondary | ICD-10-CM | POA: Diagnosis not present

## 2023-03-09 DIAGNOSIS — L720 Epidermal cyst: Secondary | ICD-10-CM | POA: Diagnosis not present

## 2023-04-21 ENCOUNTER — Ambulatory Visit (INDEPENDENT_AMBULATORY_CARE_PROVIDER_SITE_OTHER): Payer: Medicare HMO | Admitting: Pulmonary Disease

## 2023-04-21 ENCOUNTER — Ambulatory Visit: Payer: Medicare HMO | Admitting: Pulmonary Disease

## 2023-04-21 ENCOUNTER — Encounter: Payer: Self-pay | Admitting: Pulmonary Disease

## 2023-04-21 VITALS — BP 124/64 | HR 101 | Temp 98.5°F | Ht 60.0 in | Wt 119.0 lb

## 2023-04-21 DIAGNOSIS — J432 Centrilobular emphysema: Secondary | ICD-10-CM

## 2023-04-21 LAB — PULMONARY FUNCTION TEST
DL/VA % pred: 76 %
DL/VA: 3.22 ml/min/mmHg/L
DLCO cor % pred: 59 %
DLCO cor: 9.7 ml/min/mmHg
DLCO unc % pred: 59 %
DLCO unc: 9.7 ml/min/mmHg
FEF 25-75 Post: 0.47 L/sec
FEF 25-75 Pre: 0.33 L/sec
FEF2575-%Change-Post: 40 %
FEF2575-%Pred-Post: 39 %
FEF2575-%Pred-Pre: 28 %
FEV1-%Change-Post: 12 %
FEV1-%Pred-Post: 48 %
FEV1-%Pred-Pre: 43 %
FEV1-Post: 0.76 L
FEV1-Pre: 0.68 L
FEV1FVC-%Change-Post: -11 %
FEV1FVC-%Pred-Pre: 73 %
FEV6-%Change-Post: 24 %
FEV6-%Pred-Post: 76 %
FEV6-%Pred-Pre: 62 %
FEV6-Post: 1.54 L
FEV6-Pre: 1.24 L
FEV6FVC-%Change-Post: -2 %
FEV6FVC-%Pred-Post: 102 %
FEV6FVC-%Pred-Pre: 105 %
FVC-%Change-Post: 27 %
FVC-%Pred-Post: 74 %
FVC-%Pred-Pre: 58 %
FVC-Post: 1.6 L
FVC-Pre: 1.26 L
Post FEV1/FVC ratio: 48 %
Post FEV6/FVC ratio: 96 %
Pre FEV1/FVC ratio: 54 %
Pre FEV6/FVC Ratio: 99 %
RV % pred: 173 %
RV: 3.79 L
TLC % pred: 114 %
TLC: 5.09 L

## 2023-04-21 MED ORDER — ANORO ELLIPTA 62.5-25 MCG/ACT IN AEPB: 1.0000 | INHALATION_SPRAY | Freq: Every day | RESPIRATORY_TRACT | 5 refills | Status: DC

## 2023-04-21 NOTE — Patient Instructions (Addendum)
Start anoro ellipta inhaler 1 puff daily  Use albuterol inhaler 1-2 puffs every 4-6 hours as needed  Your breathing tests show moderately severe obstructive lung disease and mild diffusion defect.  We will check an overnight oximetry test.   We will refer you to the pulmonary rehab program  Follow up in 6 months

## 2023-04-21 NOTE — Patient Instructions (Signed)
Full PFT performed today. °

## 2023-04-21 NOTE — Progress Notes (Signed)
Full PFT performed today. °

## 2023-04-21 NOTE — Progress Notes (Signed)
Synopsis: Referred in May 2024 for emphysema by Lorenda Ishihara, MD  Subjective:   PATIENT ID: Sarah Harrell GENDER: female DOB: 1941/08/03, MRN: [id number]  HPI  No chief complaint on file.  Sarah Harrell is an 82 year old woman, daily smoker who returns to pulmonary clinic for emphysema.   She feels more short of breath since last visit. She stopped smoking since last visit. She has had 4 cigarettes since last visit. She used nicotine gum and lozenges. She is rarely using albuterol inhaler.   PFTs today show moderately severe obstruction, significant bronchodilator response, air trapping and mild diffusion defect.   Initial OV 02/01/23 She is currently have cold like symptoms that started 5 days ago. She is feeling better. She is able to complete activities of daily living and go grocery shopping without issue. No cough or mucous production or wheezing. She has albuterol inhaler which she is using 3-4 times per day over past 5 days, other wise she uses it a couple times per week. She is currently coughing up pale yellow sputum.   She has smoked for 60 years, she smoked 1 pack per day a majority of the time and 2 packs per day for 3-4 years. Currently smoking 7 cigarettes per day. She has tried to quit previously and is using nicotine gum to help quit. She was a [employer] information [employer] manager.   Past Medical History:  Diagnosis Date   Depressive disorder, not elsewhere classified    Elevated fasting glucose    Glucose 110 as of 09/13/13   Fatigue    Guaiac positive stools    Normal colonoscopy 2009   Insomnia, unspecified    Joint pain    Mixed hyperlipidemia    Systemic sclerosis (HCC)    Tobacco dependence    Vitamin D deficiency      Family History  Problem Relation Age of Onset   CAD Mother 65   Hypertension Father    Cancer Brother        pancreatic   Breast cancer Neg Hx      Social History   Socioeconomic History   Marital status: Divorced    Spouse name: Not on file    Number of children: Not on file   Years of education: Not on file   Highest education level: Not on file  Occupational History   Not on file  Tobacco Use   Smoking status: Every Day    Current packs/day: 0.50    Types: Cigarettes   Smokeless tobacco: Never   Tobacco comments:    using nicotine gum during the day  Substance and Sexual Activity   Alcohol use: Yes    Comment: daily   Drug use: No   Sexual activity: Not on file  Other Topics Concern   Not on file  Social History Narrative   Not on file   Social Determinants of Health   Financial Resource Strain: Not on file  Food Insecurity: Not on file  Transportation Needs: Not on file  Physical Activity: Not on file  Stress: Not on file  Social Connections: Not on file  Intimate Partner Violence: Not on file     Allergies  Allergen Reactions   Caffeine Hives     Outpatient Medications Prior to Visit  Medication Sig Dispense Refill   albuterol (VENTOLIN HFA) 108 (90 Base) MCG/ACT inhaler Inhale 1-2 puffs into the lungs every 6 (six) hours as needed.     amLODipine (NORVASC) 5 MG tablet Take  5 mg by mouth daily.     citalopram (CELEXA) 20 MG tablet Take 20 mg by mouth daily.     Multiple Vitamin (MULTIVITAMIN) tablet Take 1 tablet by mouth daily.     rosuvastatin (CRESTOR) 10 MG tablet Take 10 mg by mouth daily.     traZODone (DESYREL) 100 MG tablet Take 50 mg by mouth at bedtime.     ECHINACEA PO  (Patient not taking: Reported on 04/21/2023)     azithromycin (ZITHROMAX) 250 MG tablet Take as directed 6 tablet 0   glucosamine-chondroitin 500-400 MG tablet Take 1 tablet by mouth daily.     predniSONE (DELTASONE) 10 MG tablet Take 4 tablets (40 mg total) by mouth daily with breakfast. 20 tablet 0   No facility-administered medications prior to visit.    Review of Systems  Constitutional:  Negative for chills, fever, malaise/fatigue and weight loss.  HENT:  Negative for congestion, sinus pain and sore throat.   Eyes:  Negative.   Respiratory:  Positive for cough, sputum production, shortness of breath and wheezing. Negative for hemoptysis.   Cardiovascular:  Negative for chest pain, palpitations, orthopnea, claudication and leg swelling.  Gastrointestinal:  Negative for abdominal pain, heartburn, nausea and vomiting.  Genitourinary: Negative.   Musculoskeletal:  Negative for joint pain and myalgias.  Skin:  Negative for rash.  Neurological:  Negative for weakness.  Endo/Heme/Allergies: Negative.   Psychiatric/Behavioral: Negative.      Objective:   Vitals:   04/21/23 1403  BP: 124/64  Pulse: (!) 101  Temp: 98.5 F (36.9 C)  TempSrc: Oral  SpO2: 91%  Weight: 119 lb (54 kg)  Height: 5' (1.524 m)    Physical Exam Constitutional:      General: She is not in acute distress.    Appearance: She is not ill-appearing.  HENT:     Head: Normocephalic and atraumatic.  Eyes:     General: No scleral icterus.    Conjunctiva/sclera: Conjunctivae normal.     Pupils: Pupils are equal, round, and reactive to light.  Cardiovascular:     Rate and Rhythm: Normal rate and regular rhythm.     Pulses: Normal pulses.     Heart sounds: Normal heart sounds. No murmur heard. Pulmonary:     Effort: Pulmonary effort is normal.     Breath sounds: Decreased air movement present. No wheezing, rhonchi or rales.  Musculoskeletal:     Right lower leg: No edema.     Left lower leg: No edema.  Skin:    General: Skin is warm and dry.  Neurological:     Mental Status: She is alert.    CBC No results found for: "WBC", "RBC", "HGB", "HCT", "PLT", "MCV", "MCH", "MCHC", "RDW", "LYMPHSABS", "MONOABS", "EOSABS", "BASOSABS"   Chest imaging: CT Chest 01/12/21 Cardiovascular: Atherosclerosis of thoracic aorta is noted without aneurysm formation. Normal cardiac size. No pericardial effusion.   Mediastinum/Nodes: Small sliding-type hiatal hernia is noted. No adenopathy is noted. Thyroid gland is unremarkable.    Lungs/Pleura: No pneumothorax or pleural effusion is noted. Emphysematous disease is noted bilaterally. Mild biapical scarring is noted. Right upper lobe pulmonary nodule noted on prior exam is not visualized currently. Scarring remains in the right upper lobe.  PFT:    Latest Ref Rng & Units 04/21/2023   10:57 AM  PFT Results  FVC-Pre L 1.26  P  FVC-Predicted Pre % 58  P  FVC-Post L 1.60  P  FVC-Predicted Post % 74  P  Pre FEV1/FVC % %  54  P  Post FEV1/FCV % % 48  P  FEV1-Pre L 0.68  P  FEV1-Predicted Pre % 43  P  FEV1-Post L 0.76  P  DLCO uncorrected ml/min/mmHg 9.70  P  DLCO UNC% % 59  P  DLCO corrected ml/min/mmHg 9.70  P  DLCO COR %Predicted % 59  P  DLVA Predicted % 76  P  TLC L 5.09  P  TLC % Predicted % 114  P  RV % Predicted % 173  P    P Preliminary result    Labs:  Path:  Echo:  Heart Catheterization:    Assessment & Plan:   Centrilobular emphysema (HCC) - Plan: umeclidinium-vilanterol (ANORO ELLIPTA) 62.5-25 MCG/ACT AEPB, Pulse oximetry, overnight, AMB referral to pulmonary rehabilitation  Discussion: Sarah Harrell is an 82 year old woman, daily smoker who is referred to pulmonary clinic for emphysema.  She has moderately severe obstruction and significant bronchodilator response on PFTs today. She is to start anoro ellipta 1 puff daily and continue albuterol inhaler as needed.  We will check an overnight oxygen test.  We will place referral to pulmonary rehab.  Follow up in 6 months  Sarah Comas, MD [facility] Office: [phone]    Current Outpatient Medications:    albuterol (VENTOLIN HFA) 108 (90 Base) MCG/ACT inhaler, Inhale 1-2 puffs into the lungs every 6 (six) hours as needed., Disp: , Rfl:    amLODipine (NORVASC) 5 MG tablet, Take 5 mg by mouth daily., Disp: , Rfl:    citalopram (CELEXA) 20 MG tablet, Take 20 mg by mouth daily., Disp: , Rfl:    Multiple Vitamin (MULTIVITAMIN) tablet, Take 1 tablet by mouth  daily., Disp: , Rfl:    rosuvastatin (CRESTOR) 10 MG tablet, Take 10 mg by mouth daily., Disp: , Rfl:    traZODone (DESYREL) 100 MG tablet, Take 50 mg by mouth at bedtime., Disp: , Rfl:    umeclidinium-vilanterol (ANORO ELLIPTA) 62.5-25 MCG/ACT AEPB, Inhale 1 puff into the lungs daily., Disp: 60 each, Rfl: 5   ECHINACEA PO, , Disp: , Rfl:

## 2023-04-29 DIAGNOSIS — J432 Centrilobular emphysema: Secondary | ICD-10-CM | POA: Diagnosis not present

## 2023-05-03 ENCOUNTER — Encounter (HOSPITAL_COMMUNITY): Payer: Self-pay

## 2023-05-04 ENCOUNTER — Telehealth: Payer: Self-pay | Admitting: Pulmonary Disease

## 2023-05-04 NOTE — Telephone Encounter (Signed)
Received ONO results from [facility]. Will place results on Dr. Lanora Manis desk for review.

## 2023-05-11 ENCOUNTER — Telehealth (HOSPITAL_COMMUNITY): Payer: Self-pay

## 2023-05-11 NOTE — Telephone Encounter (Signed)
Called to confirm appt. Pt confirmed appt. Instructed pt on proper footwear. Gave directions along with department number.

## 2023-05-13 ENCOUNTER — Encounter (HOSPITAL_COMMUNITY): Payer: Self-pay

## 2023-05-13 ENCOUNTER — Encounter (HOSPITAL_COMMUNITY)
Admission: RE | Admit: 2023-05-13 | Discharge: 2023-05-13 | Disposition: A | Discharge status: Home / Self Care | Payer: Medicare HMO | Source: Ambulatory Visit | Attending: Pulmonary Disease | Admitting: Pulmonary Disease

## 2023-05-13 VITALS — BP 118/58 | HR 86 | Wt 123.0 lb

## 2023-05-13 DIAGNOSIS — J449 Chronic obstructive pulmonary disease, unspecified: Secondary | ICD-10-CM | POA: Diagnosis not present

## 2023-05-13 NOTE — Progress Notes (Addendum)
Pulmonary Individual Treatment Plan  Patient Details  Name: Sarah Harrell MRN: [id number] Date of Birth: 1941/06/11 Referring Provider:   Doristine Devoid Pulmonary Rehab Walk Test from 05/13/2023 in [facility]  Referring Provider Dewald       Initial Encounter Date:  Flowsheet Row Pulmonary Rehab Walk Test from 05/13/2023 in [facility]  Date 05/13/23       Visit Diagnosis: Stage 3 severe COPD by GOLD classification (HCC)  Patient's Home Medications on Admission:   Current Outpatient Medications:    albuterol (VENTOLIN HFA) 108 (90 Base) MCG/ACT inhaler, Inhale 1-2 puffs into the lungs every 6 (six) hours as needed., Disp: , Rfl:    amLODipine (NORVASC) 5 MG tablet, Take 5 mg by mouth daily., Disp: , Rfl:    citalopram (CELEXA) 20 MG tablet, Take 20 mg by mouth daily., Disp: , Rfl:    ECHINACEA PO, , Disp: , Rfl:    Multiple Vitamin (MULTIVITAMIN) tablet, Take 1 tablet by mouth daily., Disp: , Rfl:    rosuvastatin (CRESTOR) 10 MG tablet, Take 10 mg by mouth daily., Disp: , Rfl:    traZODone (DESYREL) 100 MG tablet, Take 50 mg by mouth at bedtime., Disp: , Rfl:    umeclidinium-vilanterol (ANORO ELLIPTA) 62.5-25 MCG/ACT AEPB, Inhale 1 puff into the lungs daily., Disp: 60 each, Rfl: 5  Past Medical History: Past Medical History:  Diagnosis Date   Depressive disorder, not elsewhere classified    Elevated fasting glucose    Glucose 110 as of 09/13/13   Fatigue    Guaiac positive stools    Normal colonoscopy 2009   Insomnia, unspecified    Joint pain    Mixed hyperlipidemia    Systemic sclerosis (HCC)    Tobacco dependence    Vitamin D deficiency     Tobacco Use: Social History   Tobacco Use  Smoking Status Every Day   Types: Cigarettes  Smokeless Tobacco Never  Tobacco Comments   Pt has not smoked for 17 week. She does consider herself a smoker     Labs: Review Flowsheet        No data to display          Capillary Blood Glucose: No results found for: "GLUCAP"   Pulmonary Assessment Scores:  Pulmonary Assessment Scores     Row Name 05/13/23 1439         ADL UCSD   ADL Phase Entry     SOB Score total 23       CAT Score   CAT Score 12       mMRC Score   mMRC Score 3             UCSD: Self-administered rating of dyspnea associated with activities of daily living (ADLs) 6-point scale (0 = "not at all" to 5 = "maximal or unable to do because of breathlessness")  Scoring Scores range from 0 to 120.  Minimally important difference is 5 units  CAT: CAT can identify the health impairment of COPD patients and is better correlated with disease progression.  CAT has a scoring range of zero to 40. The CAT score is classified into four groups of low (less than 10), medium (10 - 20), high (21-30) and very high (31-40) based on the impact level of disease on health status. A CAT score over 10 suggests significant symptoms.  A worsening CAT score could  be explained by an exacerbation, poor medication adherence, poor inhaler technique, or progression of COPD or comorbid conditions.  CAT MCID is 2 points  mMRC: mMRC (Modified Medical Research Council) Dyspnea Scale is used to assess the degree of baseline functional disability in patients of respiratory disease due to dyspnea. No minimal important difference is established. A decrease in score of 1 point or greater is considered a positive change.   Pulmonary Function Assessment:  Pulmonary Function Assessment - 05/13/23 1322       Breath   Bilateral Breath Sounds Clear    Shortness of Breath Yes;Limiting activity             Exercise Target Goals: Exercise Program Goal: Individual exercise prescription set using results from initial 6 min walk test and THRR while considering  patient's activity barriers and safety.   Exercise Prescription Goal: Initial  exercise prescription builds to 30-45 minutes a day of aerobic activity, 2-3 days per week.  Home exercise guidelines will be given to patient during program as part of exercise prescription that the participant will acknowledge.  Activity Barriers & Risk Stratification:  Activity Barriers & Cardiac Risk Stratification - 05/13/23 1321       Activity Barriers & Cardiac Risk Stratification   Activity Barriers Deconditioning;Muscular Weakness;Shortness of Breath;Arthritis;Balance Concerns    Cardiac Risk Stratification Moderate             6 Minute Walk:  6 Minute Walk     Row Name 05/13/23 1427         6 Minute Walk   Phase Initial     Distance 1320 feet     Walk Time 6 minutes     # of Rest Breaks 0     MPH 2.5     METS 2.63     RPE 11     Perceived Dyspnea  1.5     VO2 Peak 9.19     Symptoms No     Resting HR 93 bpm     Resting BP 118/58     Resting Oxygen Saturation  95 %     Exercise Oxygen Saturation  during 6 min walk 89 %     Max Ex. HR 116 bpm     Max Ex. BP 138/52     2 Minute Post BP 132/58       Interval HR   1 Minute HR 102     2 Minute HR 102     3 Minute HR 105     4 Minute HR 108     5 Minute HR 116     6 Minute HR 114     2 Minute Post HR 98     Interval Heart Rate? Yes       Interval Oxygen   Interval Oxygen? Yes     Baseline Oxygen Saturation % 95 %     1 Minute Oxygen Saturation % 95 %     1 Minute Liters of Oxygen 0 L     2 Minute Oxygen Saturation % 90 %     2 Minute Liters of Oxygen 0 L     3 Minute Oxygen Saturation % 89 %     3 Minute Liters of Oxygen 0 L     4 Minute Oxygen Saturation % 92 %     4 Minute Liters of Oxygen 0 L     5 Minute Oxygen Saturation % 91 %     5 Minute  Liters of Oxygen 0 L     6 Minute Oxygen Saturation % 91 %     6 Minute Liters of Oxygen 0 L     2 Minute Post Oxygen Saturation % 96 %     2 Minute Post Liters of Oxygen 0 L              Oxygen Initial Assessment:  Oxygen Initial Assessment -  05/13/23 1322       Home Oxygen   Home Oxygen Device None    Sleep Oxygen Prescription None    Home Exercise Oxygen Prescription None    Home Resting Oxygen Prescription None      Initial 6 min Walk   Oxygen Used None      Program Oxygen Prescription   Program Oxygen Prescription None      Intervention   Short Term Goals To learn and understand importance of monitoring SPO2 with pulse oximeter and demonstrate accurate use of the pulse oximeter.;To learn and understand importance of maintaining oxygen saturations>88%;To learn and demonstrate proper pursed lip breathing techniques or other breathing techniques. ;To learn and demonstrate proper use of respiratory medications    Long  Term Goals Exhibits compliance with exercise, home  and travel O2 prescription;Maintenance of O2 saturations>88%;Compliance with respiratory medication;Verbalizes importance of monitoring SPO2 with pulse oximeter and return demonstration;Exhibits proper breathing techniques, such as pursed lip breathing or other method taught during program session;Demonstrates proper use of MDI's             Oxygen Re-Evaluation:   Oxygen Discharge (Final Oxygen Re-Evaluation):   Initial Exercise Prescription:  Initial Exercise Prescription - 05/13/23 1400       Date of Initial Exercise RX and Referring Provider   Date 05/13/23    Referring Provider Dewald    Expected Discharge Date 08/11/23      Treadmill   MPH 2    Grade 0    Minutes 15    METs 2.53      Bike   Level 1    Minutes 15    METs 2.5      Prescription Details   Frequency (times per week) 2    Duration Progress to 30 minutes of continuous aerobic without signs/symptoms of physical distress      Intensity   THRR 40-80% of Max Heartrate 56-112    Ratings of Perceived Exertion 11-13    Perceived Dyspnea 0-4      Progression   Progression Continue to progress workloads to maintain intensity without signs/symptoms of physical distress.       Resistance Training   Training Prescription Yes    Weight blue bands    Reps 10-15             Perform Capillary Blood Glucose checks as needed.  Exercise Prescription Changes:   Exercise Comments:   Exercise Goals and Review:   Exercise Goals     Row Name 05/13/23 1321             Exercise Goals   Increase Physical Activity Yes       Intervention Provide advice, education, support and counseling about physical activity/exercise needs.;Develop an individualized exercise prescription for aerobic and resistive training based on initial evaluation findings, risk stratification, comorbidities and participant's personal goals.       Expected Outcomes Short Term: Attend rehab on a regular basis to increase amount of physical activity.;Long Term: Add in home exercise to make exercise part of routine and  to increase amount of physical activity.;Long Term: Exercising regularly at least 3-5 days a week.       Increase Strength and Stamina Yes       Intervention Provide advice, education, support and counseling about physical activity/exercise needs.;Develop an individualized exercise prescription for aerobic and resistive training based on initial evaluation findings, risk stratification, comorbidities and participant's personal goals.       Expected Outcomes Short Term: Increase workloads from initial exercise prescription for resistance, speed, and METs.;Short Term: Perform resistance training exercises routinely during rehab and add in resistance training at home;Long Term: Improve cardiorespiratory fitness, muscular endurance and strength as measured by increased METs and functional capacity ( )       Able to understand and use rate of perceived exertion (RPE) scale Yes       Intervention Provide education and explanation on how to use RPE scale       Expected Outcomes Short Term: Able to use RPE daily in rehab to express subjective intensity level;Long Term:  Able to use RPE  to guide intensity level when exercising independently       Able to understand and use Dyspnea scale Yes       Intervention Provide education and explanation on how to use Dyspnea scale       Expected Outcomes Short Term: Able to use Dyspnea scale daily in rehab to express subjective sense of shortness of breath during exertion;Long Term: Able to use Dyspnea scale to guide intensity level when exercising independently       Knowledge and understanding of Target Heart Rate Range (THRR) Yes       Intervention Provide education and explanation of THRR including how the numbers were predicted and where they are located for reference       Expected Outcomes Short Term: Able to state/look up THRR;Long Term: Able to use THRR to govern intensity when exercising independently;Short Term: Able to use daily as guideline for intensity in rehab       Understanding of Exercise Prescription Yes       Intervention Provide education, explanation, and written materials on patient's individual exercise prescription       Expected Outcomes Short Term: Able to explain program exercise prescription;Long Term: Able to explain home exercise prescription to exercise independently                Exercise Goals Re-Evaluation :   Discharge Exercise Prescription (Final Exercise Prescription Changes):   Nutrition:  Target Goals: Understanding of nutrition guidelines, daily intake of sodium 1500mg , cholesterol 200mg , calories 30% from fat and 7% or less from saturated fats, daily to have 5 or more servings of fruits and vegetables.  Biometrics:  Pre Biometrics - 05/13/23 1444       Pre Biometrics   Grip Strength 20 kg              Nutrition Therapy Plan and Nutrition Goals:   Nutrition Assessments:  MEDIFICTS Score Key: ?70 Need to make dietary changes  40-70 Heart Healthy Diet ? 40 Therapeutic Level Cholesterol Diet   Picture Your Plate Scores: <84 Unhealthy dietary pattern with much room  for improvement. 41-50 Dietary pattern unlikely to meet recommendations for good health and room for improvement. 51-60 More healthful dietary pattern, with some room for improvement.  >60 Healthy dietary pattern, although there may be some specific behaviors that could be improved.    Nutrition Goals Re-Evaluation:   Nutrition Goals Discharge (Final Nutrition Goals Re-Evaluation):  Psychosocial: Target Goals: Acknowledge presence or absence of significant depression and/or stress, maximize coping skills, provide positive support system. Participant is able to verbalize types and ability to use techniques and skills needed for reducing stress and depression.  Initial Review & Psychosocial Screening:  Initial Psych Review & Screening - 05/13/23 1316       Initial Review   Current issues with History of Depression;Current Psychotropic Meds      Family Dynamics   Good Support System? Yes    Comments Pt denies psychosocial barriers at this time      Barriers   Psychosocial barriers to participate in program There are no identifiable barriers or psychosocial needs.      Screening Interventions   Interventions Encouraged to exercise             Quality of Life Scores:  Scores of 19 and below usually indicate a poorer quality of life in these areas.  A difference of  2-3 points is a clinically meaningful difference.  A difference of 2-3 points in the total score of the Quality of Life Index has been associated with significant improvement in overall quality of life, self-image, physical symptoms, and general health in studies assessing change in quality of life.  PHQ-9: Review Flowsheet       05/13/2023  Depression screen PHQ 2/9  Decreased Interest 0  Down, Depressed, Hopeless 0  PHQ - 2 Score 0  Altered sleeping 1  Tired, decreased energy 1  Change in appetite 0  Feeling bad or failure about yourself  0  Trouble concentrating 0  Moving slowly or fidgety/restless 0   Suicidal thoughts 0  PHQ-9 Score 2  Difficult doing work/chores Not difficult at all    Details           Interpretation of Total Score  Total Score Depression Severity:  1-4 = Minimal depression, 5-9 = Mild depression, 10-14 = Moderate depression, 15-19 = Moderately severe depression, 20-27 = Severe depression   Psychosocial Evaluation and Intervention:  Psychosocial Evaluation - 05/13/23 1318       Psychosocial Evaluation & Interventions   Interventions Encouraged to exercise with the program and follow exercise prescription    Comments Sarah Harrell denies any psychosocial barriers or concerns at this time.    Expected Outcomes For Sarah Harrell to participate in PR free of any psychosocial barriers or concerns    Continue Psychosocial Services  No Follow up required             Psychosocial Re-Evaluation:   Psychosocial Discharge (Final Psychosocial Re-Evaluation):   Education: Education Goals: Education classes will be provided on a weekly basis, covering required topics. Participant will state understanding/return demonstration of topics presented.  Learning Barriers/Preferences:  Learning Barriers/Preferences - 05/13/23 1318       Learning Barriers/Preferences   Learning Barriers None    Learning Preferences Group Instruction;Skilled Demonstration;Individual Instruction             Education Topics: Introduction to Pulmonary Rehab Group instruction provided by PowerPoint, verbal discussion, and written material to support subject matter. Instructor reviews what Pulmonary Rehab is, the purpose of the program, and how patients are referred.     Know Your Numbers Group instruction that is supported by a PowerPoint presentation. Instructor discusses importance of knowing and understanding resting, exercise, and post-exercise oxygen saturation, heart rate, and blood pressure. Oxygen saturation, heart rate, blood pressure, rating of perceived exertion, and dyspnea are  reviewed along with a normal range for these  values.    Exercise for the Pulmonary Patient Group instruction that is supported by a PowerPoint presentation. Instructor discusses benefits of exercise, core components of exercise, frequency, duration, and intensity of an exercise routine, importance of utilizing pulse oximetry during exercise, safety while exercising, and options of places to exercise outside of rehab.       MET Level  Group instruction provided by PowerPoint, verbal discussion, and written material to support subject matter. Instructor reviews what METs are and how to increase METs.    Pulmonary Medications Verbally interactive group education provided by instructor with focus on inhaled medications and proper administration.   Anatomy and Physiology of the Respiratory System Group instruction provided by PowerPoint, verbal discussion, and written material to support subject matter. Instructor reviews respiratory cycle and anatomical components of the respiratory system and their functions. Instructor also reviews differences in obstructive and restrictive respiratory diseases with examples of each.    Oxygen Safety Group instruction provided by PowerPoint, verbal discussion, and written material to support subject matter. There is an overview of "What is Oxygen" and "Why do we need it".  Instructor also reviews how to create a safe environment for oxygen use, the importance of using oxygen as prescribed, and the risks of noncompliance. There is a brief discussion on traveling with oxygen and resources the patient may utilize.   Oxygen Use Group instruction provided by PowerPoint, verbal discussion, and written material to discuss how supplemental oxygen is prescribed and different types of oxygen supply systems. Resources for more information are provided.    Breathing Techniques Group instruction that is supported by demonstration and informational handouts.  Instructor discusses the benefits of pursed lip and diaphragmatic breathing and detailed demonstration on how to perform both.     Risk Factor Reduction Group instruction that is supported by a PowerPoint presentation. Instructor discusses the definition of a risk factor, different risk factors for pulmonary disease, and how the heart and lungs work together.   MD Day A group question and answer session with a medical doctor that allows participants to ask questions that relate to their pulmonary disease state.   Nutrition for the Pulmonary Patient Group instruction provided by PowerPoint slides, verbal discussion, and written materials to support subject matter. The instructor gives an explanation and review of healthy diet recommendations, which includes a discussion on weight management, recommendations for fruit and vegetable consumption, as well as protein, fluid, caffeine, fiber, sodium, sugar, and alcohol. Tips for eating when patients are short of breath are discussed.    Other Education Group or individual verbal, written, or video instructions that support the educational goals of the pulmonary rehab program.    Knowledge Questionnaire Score:  Knowledge Questionnaire Score - 05/13/23 1439       Knowledge Questionnaire Score   Pre Score 17/18             Core Components/Risk Factors/Patient Goals at Admission:  Personal Goals and Risk Factors at Admission - 05/13/23 1319       Core Components/Risk Factors/Patient Goals on Admission    Weight Management Yes;Weight Loss    Intervention Weight Management: Develop a combined nutrition and exercise program designed to reach desired caloric intake, while maintaining appropriate intake of nutrient and fiber, sodium and fats, and appropriate energy expenditure required for the weight goal.;Weight Management: Provide education and appropriate resources to help participant work on and attain dietary goals.;Weight  Management/Obesity: Establish reasonable short term and long term weight goals.;Obesity: Provide education and appropriate resources  to help participant work on and attain dietary goals.    Expected Outcomes Short Term: Continue to assess and modify interventions until short term weight is achieved;Long Term: Adherence to nutrition and physical activity/exercise program aimed toward attainment of established weight goal;Weight Maintenance: Understanding of the daily nutrition guidelines, which includes 25-35% calories from fat, 7% or less cal from saturated fats, less than 200mg  cholesterol, less than 1.5gm of sodium, & 5 or more servings of fruits and vegetables daily;Weight Loss: Understanding of general recommendations for a balanced deficit meal plan, which promotes 1-2 lb weight loss per week and includes a negative energy balance of [phone] kcal/d;Understanding recommendations for meals to include 15-35% energy as protein, 25-35% energy from fat, 35-60% energy from carbohydrates, less than 200mg  of dietary cholesterol, 20-35 gm of total fiber daily;Understanding of distribution of calorie intake throughout the day with the consumption of 4-5 meals/snacks;Weight Gain: Understanding of general recommendations for a high calorie, high protein meal plan that promotes weight gain by distributing calorie intake throughout the day with the consumption for 4-5 meals, snacks, and/or supplements    Tobacco Cessation Yes    Number of packs per day Sarah Harrell states she still considers herself a smoker even though she has not had a cigarette in 17 weeks   Sarah Harrell has not smoked in 17 weeks   Intervention Assist the participant in steps to quit. Provide individualized education and counseling about committing to Tobacco Cessation, relapse prevention, and pharmacological support that can be provided by physician.;Education officer, environmental, assist with locating and accessing local/national Quit Smoking programs, and support  quit date choice.    Expected Outcomes Short Term: Will demonstrate readiness to quit, by selecting a quit date.;Short Term: Will quit all tobacco product use, adhering to prevention of relapse plan.;Long Term: Complete abstinence from all tobacco products for at least 12 months from quit date.    Improve shortness of breath with ADL's Yes    Intervention Provide education, individualized exercise plan and daily activity instruction to help decrease symptoms of SOB with activities of daily living.    Expected Outcomes Short Term: Improve cardiorespiratory fitness to achieve a reduction of symptoms when performing ADLs;Long Term: Be able to perform more ADLs without symptoms or delay the onset of symptoms             Core Components/Risk Factors/Patient Goals Review:    Core Components/Risk Factors/Patient Goals at Discharge (Final Review):    ITP Comments:   Comments: Dr. Mechele Collin is Medical Director for Pulmonary Rehab at [facility].

## 2023-05-13 NOTE — Progress Notes (Signed)
Sarah Harrell 81 y.o. female Pulmonary Rehab Orientation Note This patient who was referred to Pulmonary Rehab by Dr. Francine Graven with the diagnosis of COPD 3 arrived today in Cardiac and Pulmonary Rehab. She  arrived ambulatory with normal gait. She  does not carry portable oxygen. Per patient, Sarah Harrell uses oxygen never. Color good, skin warm and dry. Patient is oriented to time and place. Patient's medical history, psychosocial health, and medications reviewed. Psychosocial assessment reveals patient lives with alone. Sarah Harrell is currently retired. Patient hobbies include spending time with others and sewing. Patient reports her stress level is low. Areas of stress/anxiety include health. Patient does not exhibit signs of depression. PHQ2/9 score 0/2. Sarah Harrell shows good  coping skills with positive outlook on life. Offered emotional support and reassurance. Will continue to monitor. Physical assessment performed by Nurse pick: Sarah Lineman RN. Please see their orientation physical assessment note. Sarah Harrell reports she does take medications as prescribed. Patient states she follows a regular  diet. The patient reports no specific efforts to gain or lose weight.. Patient's weight will be monitored closely. Demonstration and practice of PLB using pulse oximeter. Sarah Harrell able to return demonstration satisfactorily. Safety and hand hygiene in the exercise area reviewed with patient. Sarah Harrell voices understanding of the information reviewed. Department expectations discussed with patient and achievable goals were set. The patient shows enthusiasm about attending the program and we look forward to working with Sarah Harrell. Bliss completed a 6 min walk test today and is scheduled to begin exercise on 05/19/23@1 :15.   1250-1415 Sarah Harrell, Sarah Harrell

## 2023-05-13 NOTE — Progress Notes (Signed)
Pulmonary Rehab Orientation Physical Assessment Note  Physical assessment reveals  Pt is alert and oriented x 3.  Heart rate is normal, breath sounds assessment completed by Durel Salts RT. Reports no cough. Bowel sounds present.  Pt denies abdominal discomfort, nausea, vomiting or diarrhea. Grip strength equal, strong. Distal pulses palpable with ; trace swelling to the left ankle.  This is chronic for this pt with no known etiology. Alanson Aly, BSN Cardiac and Emergency planning/management officer

## 2023-05-13 NOTE — Progress Notes (Signed)
Charlene Brooke 82 y.o. female  Initial Psychosocial Assessment  Pt psychosocial assessment reveals pt lives alone. Pt is currently retired. Pt hobbies include spending time with others, sewing, and making jewlery . Pt reports her stress level is low. Areas of stress/anxiety include health.  Pt does not exhibit signs of depression. Pt shows good  coping skills with positive outlook . Offered emotional support and reassurance. Monitor and evaluate progress toward psychosocial goal(s).  Goal(s): Help patient work toward returning to meaningful activities that improve patient's QOL and are attainable with patient's lung disease   05/13/2023 2:27 PM

## 2023-05-19 ENCOUNTER — Encounter (HOSPITAL_COMMUNITY)
Admission: RE | Admit: 2023-05-19 | Discharge: 2023-05-19 | Disposition: A | Discharge status: Home / Self Care | Payer: Medicare HMO | Source: Ambulatory Visit | Attending: Pulmonary Disease | Admitting: Pulmonary Disease

## 2023-05-19 DIAGNOSIS — J449 Chronic obstructive pulmonary disease, unspecified: Secondary | ICD-10-CM | POA: Diagnosis not present

## 2023-05-19 NOTE — Progress Notes (Signed)
Daily Session Note  Patient Details  Name: Sarah Harrell MRN: [id number] Date of Birth: 04-29-1941 Referring Provider:   Doristine Devoid Pulmonary Rehab Walk Test from 05/13/2023 in [facility] for Heart, Vascular, & Lung Health  Referring Provider Dewald       Encounter Date: 05/19/2023  Check In:  Session Check In - 05/19/23 1327       Check-In   Supervising physician immediately available to respond to emergencies [facility] MD immediately available    Physician(s) Robin Searing, NP    Location [facility]    Staff Present Raford Pitcher, MS, ACSM-CEP, Exercise Physiologist;Mary Gerre Scull, RN, BSN;Casey Katrinka Blazing, RT;Samantha Belarus, RD, LDN;Kenzie Flakes [facility], ACSM-CEP, Exercise Physiologist    Virtual Visit No    Medication changes reported     No    Fall or balance concerns reported    No    Tobacco Cessation No Change    Warm-up and Cool-down Performed as group-led instruction    Resistance Training Performed Yes    VAD Patient? No    PAD/SET Patient? No      Pain Assessment   Currently in Pain? No/denies    Multiple Pain Sites No             Capillary Blood Glucose: No results found for this or any previous visit (from the past 24 hour(s)).    Social History   Tobacco Use  Smoking Status Every Day   Types: Cigarettes  Smokeless Tobacco Never  Tobacco Comments   Pt has not smoked for 17 week. She does consider herself a smoker    Goals Met:  Exercise tolerated well No report of concerns or symptoms today Strength training completed today  Goals Unmet:  Not Applicable  Comments: Service time is from 1314 to 1448.    Dr. Mechele Collin is Medical Director for Pulmonary Rehab at [facility].

## 2023-05-24 ENCOUNTER — Encounter (HOSPITAL_COMMUNITY)
Admission: RE | Admit: 2023-05-24 | Discharge: 2023-05-24 | Disposition: A | Discharge status: Home / Self Care | Payer: Medicare HMO | Source: Ambulatory Visit | Attending: Pulmonary Disease | Admitting: Pulmonary Disease

## 2023-05-24 DIAGNOSIS — J449 Chronic obstructive pulmonary disease, unspecified: Secondary | ICD-10-CM | POA: Diagnosis not present

## 2023-05-24 NOTE — Progress Notes (Signed)
Daily Session Note  Patient Details  Name: Sarah Harrell MRN: [id number] Date of Birth: Dec 09, 1940 Referring Provider:   Doristine Devoid Pulmonary Rehab Walk Test from 05/13/2023 in [facility] for Heart, Vascular, & Lung Health  Referring Provider Dewald       Encounter Date: 05/24/2023  Check In:  Session Check In - 05/24/23 1331       Check-In   Supervising physician immediately available to respond to emergencies [facility] MD immediately available    Physician(s) Carlyon Shadow, NP    [facility]    Staff Present Raford Pitcher, MS, ACSM-CEP, Exercise Physiologist;Melvina Pangelinan Gerre Scull, RN, BSN;Casey Katrinka Blazing, RT;Samantha Belarus, RD, LDN;Randi [facility], ACSM-CEP, Exercise Physiologist    Virtual Visit No    Medication changes reported     No    Fall or balance concerns reported    No    Tobacco Cessation No Change    Warm-up and Cool-down Performed as group-led instruction    Resistance Training Performed Yes    VAD Patient? No    PAD/SET Patient? No      Pain Assessment   Currently in Pain? No/denies    Multiple Pain Sites No             Capillary Blood Glucose: No results found for this or any previous visit (from the past 24 hour(s)).    Social History   Tobacco Use  Smoking Status Every Day   Types: Cigarettes  Smokeless Tobacco Never  Tobacco Comments   Pt has not smoked for 17 week. She does consider herself a smoker    Goals Met:  Exercise tolerated well No report of concerns or symptoms today Strength training completed today  Goals Unmet:  Not Applicable  Comments: Service time is from 1311 to 1426    Dr. Mechele Collin is Medical Director for Pulmonary Rehab at [facility].

## 2023-05-26 ENCOUNTER — Encounter (HOSPITAL_COMMUNITY)
Admission: RE | Admit: 2023-05-26 | Discharge: 2023-05-26 | Disposition: A | Discharge status: Home / Self Care | Payer: Medicare HMO | Source: Ambulatory Visit | Attending: Pulmonary Disease | Admitting: Pulmonary Disease

## 2023-05-26 DIAGNOSIS — J449 Chronic obstructive pulmonary disease, unspecified: Secondary | ICD-10-CM

## 2023-05-26 NOTE — Progress Notes (Signed)
Daily Session Note  Patient Details  Name: Sarah Harrell MRN: [id number] Date of Birth: 01-Mar-1941 Referring Provider:   Doristine Devoid Pulmonary Rehab Walk Test from 05/13/2023 in [facility] for Heart, Vascular, & Lung Health  Referring Provider Dewald       Encounter Date: 05/26/2023  Check In:  Session Check In - 05/26/23 1336       Check-In   Supervising physician immediately available to respond to emergencies [facility] MD immediately available    Physician(s) Bernadene Person, NP    [facility]    Staff Present Raford Pitcher, MS, ACSM-CEP, Exercise Physiologist;Cleo Santucci Erin Sons BS, ACSM-CEP, Exercise Physiologist;Maria Whitaker, RN, BSN    Virtual Visit No    Medication changes reported     No    Fall or balance concerns reported    No    Tobacco Cessation No Change    Warm-up and Cool-down Performed as group-led instruction    Resistance Training Performed Yes    VAD Patient? No    PAD/SET Patient? No      Pain Assessment   Currently in Pain? No/denies    Multiple Pain Sites No             Capillary Blood Glucose: No results found for this or any previous visit (from the past 24 hour(s)).    Social History   Tobacco Use  Smoking Status Every Day   Types: Cigarettes  Smokeless Tobacco Never  Tobacco Comments   Pt has not smoked for 17 week. She does consider herself a smoker    Goals Met:  Proper associated with RPD/PD & O2 Sat Independence with exercise equipment Exercise tolerated well No report of concerns or symptoms today Strength training completed today  Goals Unmet:  Not Applicable  Comments: Service time is from 1313 to 1445.    Dr. Mechele Collin is Medical Director for Pulmonary Rehab at [facility].

## 2023-05-31 ENCOUNTER — Encounter (HOSPITAL_COMMUNITY)
Admission: RE | Admit: 2023-05-31 | Discharge: 2023-05-31 | Disposition: A | Discharge status: Home / Self Care | Payer: Medicare HMO | Source: Ambulatory Visit | Attending: Pulmonary Disease | Admitting: Pulmonary Disease

## 2023-05-31 VITALS — Wt 124.3 lb

## 2023-05-31 DIAGNOSIS — F17210 Nicotine dependence, cigarettes, uncomplicated: Secondary | ICD-10-CM | POA: Diagnosis not present

## 2023-05-31 DIAGNOSIS — Z5189 Encounter for other specified aftercare: Secondary | ICD-10-CM | POA: Diagnosis not present

## 2023-05-31 DIAGNOSIS — J449 Chronic obstructive pulmonary disease, unspecified: Secondary | ICD-10-CM | POA: Diagnosis not present

## 2023-05-31 NOTE — Progress Notes (Signed)
Daily Session Note  Patient Details  Name: Sarah Harrell MRN: [id number] Date of Birth: July 04, 1941 Referring Provider:   Doristine Devoid Pulmonary Rehab Walk Test from 05/13/2023 in [facility] for Heart, Vascular, & Lung Health  Referring Provider Dewald       Encounter Date: 05/31/2023  Check In:  Session Check In - 05/31/23 1334       Check-In   Supervising physician immediately available to respond to emergencies [facility] MD immediately available    Physician(s) Bernadene Person, NP    [facility]    Staff Present Raford Pitcher, MS, ACSM-CEP, Exercise Physiologist;Koree Staheli Erin Sons BS, ACSM-CEP, Exercise Physiologist;Mary Gerre Scull, RN, BSN    Virtual Visit No    Medication changes reported     No    Fall or balance concerns reported    No    Tobacco Cessation No Change    Warm-up and Cool-down Performed as group-led instruction    Resistance Training Performed Yes    VAD Patient? No    PAD/SET Patient? No      Pain Assessment   Currently in Pain? No/denies    Multiple Pain Sites No             Capillary Blood Glucose: No results found for this or any previous visit (from the past 24 hour(s)).   Exercise Prescription Changes - 05/31/23 1500       Response to Exercise   Blood Pressure (Admit) 118/60    Blood Pressure (Exercise) 124/56    Blood Pressure (Exit) 110/60    Heart Rate (Admit) 85 bpm    Heart Rate (Exercise) 110 bpm    Heart Rate (Exit) 87 bpm    Oxygen Saturation (Admit) 95 %    Oxygen Saturation (Exercise) 94 %    Oxygen Saturation (Exit) 96 %    Rating of Perceived Exertion (Exercise) 11    Perceived Dyspnea (Exercise) 1    Duration Continue with 30 min of aerobic exercise without signs/symptoms of physical distress.    Intensity THRR unchanged      Progression   Progression Continue to progress workloads to maintain intensity without signs/symptoms of physical distress.      Resistance Training    Training Prescription Yes    Weight blue bands    Reps 10-15    Time 10 Minutes      Treadmill   MPH 2    Grade 0    Minutes 15    METs 2.53      Recumbant Elliptical   Level 2    Minutes 15    METs 2.4             Social History   Tobacco Use  Smoking Status Every Day   Types: Cigarettes  Smokeless Tobacco Never  Tobacco Comments   Pt has not smoked for 17 week. She does consider herself a smoker    Goals Met:  Proper associated with RPD/PD & O2 Sat Independence with exercise equipment Exercise tolerated well No report of concerns or symptoms today Strength training completed today  Goals Unmet:  Not Applicable  Comments: Service time is from 1313 to 1446.    Dr. Mechele Collin is Medical Director for Pulmonary Rehab at [facility].

## 2023-06-01 NOTE — Progress Notes (Signed)
Pulmonary Individual Treatment Plan  Patient Details  Name: Sarah Harrell MRN: [id number] Date of Birth: Sep 21, 1941 Referring Provider:   Doristine Devoid Pulmonary Rehab Walk Test from 05/13/2023 in [facility]  Referring Provider Dewald       Initial Encounter Date:  Flowsheet Row Pulmonary Rehab Walk Test from 05/13/2023 in [facility]  Date 05/13/23       Visit Diagnosis: Stage 3 severe COPD by GOLD classification (HCC)  Patient's Home Medications on Admission:   Current Outpatient Medications:    albuterol (VENTOLIN HFA) 108 (90 Base) MCG/ACT inhaler, Inhale 1-2 puffs into the lungs every 6 (six) hours as needed., Disp: , Rfl:    amLODipine (NORVASC) 5 MG tablet, Take 5 mg by mouth daily., Disp: , Rfl:    citalopram (CELEXA) 20 MG tablet, Take 20 mg by mouth daily., Disp: , Rfl:    ECHINACEA PO, , Disp: , Rfl:    Multiple Vitamin (MULTIVITAMIN) tablet, Take 1 tablet by mouth daily., Disp: , Rfl:    rosuvastatin (CRESTOR) 10 MG tablet, Take 10 mg by mouth daily., Disp: , Rfl:    traZODone (DESYREL) 100 MG tablet, Take 50 mg by mouth at bedtime., Disp: , Rfl:    umeclidinium-vilanterol (ANORO ELLIPTA) 62.5-25 MCG/ACT AEPB, Inhale 1 puff into the lungs daily., Disp: 60 each, Rfl: 5  Past Medical History: Past Medical History:  Diagnosis Date   Depressive disorder, not elsewhere classified    Elevated fasting glucose    Glucose 110 as of 09/13/13   Fatigue    Guaiac positive stools    Normal colonoscopy 2009   Insomnia, unspecified    Joint pain    Mixed hyperlipidemia    Systemic sclerosis (HCC)    Tobacco dependence    Vitamin D deficiency     Tobacco Use: Social History   Tobacco Use  Smoking Status Every Day   Types: Cigarettes  Smokeless Tobacco Never  Tobacco Comments   Pt has not smoked for 17 week. She does consider herself a smoker     Labs: Review Flowsheet        No data to display          Capillary Blood Glucose: No results found for: "GLUCAP"   Pulmonary Assessment Scores:  Pulmonary Assessment Scores     Row Name 05/13/23 1439         ADL UCSD   ADL Phase Entry     SOB Score total 23       CAT Score   CAT Score 12       mMRC Score   mMRC Score 3             UCSD: Self-administered rating of dyspnea associated with activities of daily living (ADLs) 6-point scale (0 = "not at all" to 5 = "maximal or unable to do because of breathlessness")  Scoring Scores range from 0 to 120.  Minimally important difference is 5 units  CAT: CAT can identify the health impairment of COPD patients and is better correlated with disease progression.  CAT has a scoring range of zero to 40. The CAT score is classified into four groups of low (less than 10), medium (10 - 20), high (21-30) and very high (31-40) based on the impact level of disease on health status. A CAT score over 10 suggests significant symptoms.  A worsening CAT score could  be explained by an exacerbation, poor medication adherence, poor inhaler technique, or progression of COPD or comorbid conditions.  CAT MCID is 2 points  mMRC: mMRC (Modified Medical Research Council) Dyspnea Scale is used to assess the degree of baseline functional disability in patients of respiratory disease due to dyspnea. No minimal important difference is established. A decrease in score of 1 point or greater is considered a positive change.   Pulmonary Function Assessment:  Pulmonary Function Assessment - 05/13/23 1322       Breath   Bilateral Breath Sounds Clear    Shortness of Breath Yes;Limiting activity             Exercise Target Goals: Exercise Program Goal: Individual exercise prescription set using results from initial 6 min walk test and THRR while considering  patient's activity barriers and safety.   Exercise Prescription Goal: Initial  exercise prescription builds to 30-45 minutes a day of aerobic activity, 2-3 days per week.  Home exercise guidelines will be given to patient during program as part of exercise prescription that the participant will acknowledge.  Activity Barriers & Risk Stratification:  Activity Barriers & Cardiac Risk Stratification - 05/13/23 1321       Activity Barriers & Cardiac Risk Stratification   Activity Barriers Deconditioning;Muscular Weakness;Shortness of Breath;Arthritis;Balance Concerns    Cardiac Risk Stratification Moderate             6 Minute Walk:  6 Minute Walk     Row Name 05/13/23 1427         6 Minute Walk   Phase Initial     Distance 1320 feet     Walk Time 6 minutes     # of Rest Breaks 0     MPH 2.5     METS 2.63     RPE 11     Perceived Dyspnea  1.5     VO2 Peak 9.19     Symptoms No     Resting HR 93 bpm     Resting BP 118/58     Resting Oxygen Saturation  95 %     Exercise Oxygen Saturation  during 6 min walk 89 %     Max Ex. HR 116 bpm     Max Ex. BP 138/52     2 Minute Post BP 132/58       Interval HR   1 Minute HR 102     2 Minute HR 102     3 Minute HR 105     4 Minute HR 108     5 Minute HR 116     6 Minute HR 114     2 Minute Post HR 98     Interval Heart Rate? Yes       Interval Oxygen   Interval Oxygen? Yes     Baseline Oxygen Saturation % 95 %     1 Minute Oxygen Saturation % 95 %     1 Minute Liters of Oxygen 0 L     2 Minute Oxygen Saturation % 90 %     2 Minute Liters of Oxygen 0 L     3 Minute Oxygen Saturation % 89 %     3 Minute Liters of Oxygen 0 L     4 Minute Oxygen Saturation % 92 %     4 Minute Liters of Oxygen 0 L     5 Minute Oxygen Saturation % 91 %     5 Minute  Liters of Oxygen 0 L     6 Minute Oxygen Saturation % 91 %     6 Minute Liters of Oxygen 0 L     2 Minute Post Oxygen Saturation % 96 %     2 Minute Post Liters of Oxygen 0 L              Oxygen Initial Assessment:  Oxygen Initial Assessment -  05/13/23 1322       Home Oxygen   Home Oxygen Device None    Sleep Oxygen Prescription None    Home Exercise Oxygen Prescription None    Home Resting Oxygen Prescription None      Initial 6 min Walk   Oxygen Used None      Program Oxygen Prescription   Program Oxygen Prescription None      Intervention   Short Term Goals To learn and understand importance of monitoring SPO2 with pulse oximeter and demonstrate accurate use of the pulse oximeter.;To learn and understand importance of maintaining oxygen saturations>88%;To learn and demonstrate proper pursed lip breathing techniques or other breathing techniques. ;To learn and demonstrate proper use of respiratory medications    Long  Term Goals Exhibits compliance with exercise, home  and travel O2 prescription;Maintenance of O2 saturations>88%;Compliance with respiratory medication;Verbalizes importance of monitoring SPO2 with pulse oximeter and return demonstration;Exhibits proper breathing techniques, such as pursed lip breathing or other method taught during program session;Demonstrates proper use of MDI's             Oxygen Re-Evaluation:  Oxygen Re-Evaluation     Row Name 05/24/23 0847             Program Oxygen Prescription   Program Oxygen Prescription None         Home Oxygen   Home Oxygen Device None       Sleep Oxygen Prescription None       Home Exercise Oxygen Prescription None       Home Resting Oxygen Prescription None         Goals/Expected Outcomes   Short Term Goals To learn and understand importance of monitoring SPO2 with pulse oximeter and demonstrate accurate use of the pulse oximeter.;To learn and understand importance of maintaining oxygen saturations>88%;To learn and demonstrate proper pursed lip breathing techniques or other breathing techniques. ;To learn and demonstrate proper use of respiratory medications       Long  Term Goals Exhibits compliance with exercise, home  and travel O2  prescription;Maintenance of O2 saturations>88%;Compliance with respiratory medication;Verbalizes importance of monitoring SPO2 with pulse oximeter and return demonstration;Exhibits proper breathing techniques, such as pursed lip breathing or other method taught during program session;Demonstrates proper use of MDI's       Goals/Expected Outcomes Compliance and understanding of oxygen saturation monitoring and breathing techniques to decrease shortness of breath                Oxygen Discharge (Final Oxygen Re-Evaluation):  Oxygen Re-Evaluation - 05/24/23 0847       Program Oxygen Prescription   Program Oxygen Prescription None      Home Oxygen   Home Oxygen Device None    Sleep Oxygen Prescription None    Home Exercise Oxygen Prescription None    Home Resting Oxygen Prescription None      Goals/Expected Outcomes   Short Term Goals To learn and understand importance of monitoring SPO2 with pulse oximeter and demonstrate accurate use of the pulse oximeter.;To learn and understand importance of  maintaining oxygen saturations>88%;To learn and demonstrate proper pursed lip breathing techniques or other breathing techniques. ;To learn and demonstrate proper use of respiratory medications    Long  Term Goals Exhibits compliance with exercise, home  and travel O2 prescription;Maintenance of O2 saturations>88%;Compliance with respiratory medication;Verbalizes importance of monitoring SPO2 with pulse oximeter and return demonstration;Exhibits proper breathing techniques, such as pursed lip breathing or other method taught during program session;Demonstrates proper use of MDI's    Goals/Expected Outcomes Compliance and understanding of oxygen saturation monitoring and breathing techniques to decrease shortness of breath             Initial Exercise Prescription:  Initial Exercise Prescription - 05/13/23 1400       Date of Initial Exercise RX and Referring Provider   Date 05/13/23     Referring Provider Dewald    Expected Discharge Date 08/11/23      Treadmill   MPH 2    Grade 0    Minutes 15    METs 2.53      Bike   Level 1    Minutes 15    METs 2.5      Prescription Details   Frequency (times per week) 2    Duration Progress to 30 minutes of continuous aerobic without signs/symptoms of physical distress      Intensity   THRR 40-80% of Max Heartrate 56-112    Ratings of Perceived Exertion 11-13    Perceived Dyspnea 0-4      Progression   Progression Continue to progress workloads to maintain intensity without signs/symptoms of physical distress.      Resistance Training   Training Prescription Yes    Weight blue bands    Reps 10-15             Perform Capillary Blood Glucose checks as needed.  Exercise Prescription Changes:   Exercise Prescription Changes     Row Name 05/31/23 1500             Response to Exercise   Blood Pressure (Admit) 118/60       Blood Pressure (Exercise) 124/56       Blood Pressure (Exit) 110/60       Heart Rate (Admit) 85 bpm       Heart Rate (Exercise) 110 bpm       Heart Rate (Exit) 87 bpm       Oxygen Saturation (Admit) 95 %       Oxygen Saturation (Exercise) 94 %       Oxygen Saturation (Exit) 96 %       Rating of Perceived Exertion (Exercise) 11       Perceived Dyspnea (Exercise) 1       Duration Continue with 30 min of aerobic exercise without signs/symptoms of physical distress.       Intensity THRR unchanged         Progression   Progression Continue to progress workloads to maintain intensity without signs/symptoms of physical distress.         Resistance Training   Training Prescription Yes       Weight blue bands       Reps 10-15       Time 10 Minutes         Treadmill   MPH 2       Grade 0       Minutes 15       METs 2.53  Recumbant Elliptical   Level 2       Minutes 15       METs 2.4                Exercise Comments:   Exercise Comments     Row Name 05/19/23  1502           Exercise Comments Pt completed first day of group exercise. Walked on TM for 15 min, 2.0 mph, 0 incline, 2.3 METs. She then exercised on the recumbent elliptical for 15 min, level 2, METs 1.3. tolerated well. Performed warm up and cool down unlimited. Discussed METs with good reception.                Exercise Goals and Review:   Exercise Goals     Row Name 05/13/23 1321 05/24/23 0846           Exercise Goals   Increase Physical Activity Yes Yes      Intervention Provide advice, education, support and counseling about physical activity/exercise needs.;Develop an individualized exercise prescription for aerobic and resistive training based on initial evaluation findings, risk stratification, comorbidities and participant's personal goals. Provide advice, education, support and counseling about physical activity/exercise needs.;Develop an individualized exercise prescription for aerobic and resistive training based on initial evaluation findings, risk stratification, comorbidities and participant's personal goals.      Expected Outcomes Short Term: Attend rehab on a regular basis to increase amount of physical activity.;Long Term: Add in home exercise to make exercise part of routine and to increase amount of physical activity.;Long Term: Exercising regularly at least 3-5 days a week. Short Term: Attend rehab on a regular basis to increase amount of physical activity.;Long Term: Add in home exercise to make exercise part of routine and to increase amount of physical activity.;Long Term: Exercising regularly at least 3-5 days a week.      Increase Strength and Stamina Yes Yes      Intervention Provide advice, education, support and counseling about physical activity/exercise needs.;Develop an individualized exercise prescription for aerobic and resistive training based on initial evaluation findings, risk stratification, comorbidities and participant's personal goals. Provide  advice, education, support and counseling about physical activity/exercise needs.;Develop an individualized exercise prescription for aerobic and resistive training based on initial evaluation findings, risk stratification, comorbidities and participant's personal goals.      Expected Outcomes Short Term: Increase workloads from initial exercise prescription for resistance, speed, and METs.;Short Term: Perform resistance training exercises routinely during rehab and add in resistance training at home;Long Term: Improve cardiorespiratory fitness, muscular endurance and strength as measured by increased METs and functional capacity ( ) Short Term: Increase workloads from initial exercise prescription for resistance, speed, and METs.;Short Term: Perform resistance training exercises routinely during rehab and add in resistance training at home;Long Term: Improve cardiorespiratory fitness, muscular endurance and strength as measured by increased METs and functional capacity ( )      Able to understand and use rate of perceived exertion (RPE) scale Yes Yes      Intervention Provide education and explanation on how to use RPE scale Provide education and explanation on how to use RPE scale      Expected Outcomes Short Term: Able to use RPE daily in rehab to express subjective intensity level;Long Term:  Able to use RPE to guide intensity level when exercising independently Short Term: Able to use RPE daily in rehab to express subjective intensity level;Long Term:  Able to use RPE to guide intensity level when  exercising independently      Able to understand and use Dyspnea scale Yes Yes      Intervention Provide education and explanation on how to use Dyspnea scale Provide education and explanation on how to use Dyspnea scale      Expected Outcomes Short Term: Able to use Dyspnea scale daily in rehab to express subjective sense of shortness of breath during exertion;Long Term: Able to use Dyspnea scale to  guide intensity level when exercising independently Short Term: Able to use Dyspnea scale daily in rehab to express subjective sense of shortness of breath during exertion;Long Term: Able to use Dyspnea scale to guide intensity level when exercising independently      Knowledge and understanding of Target Heart Rate Range (THRR) Yes Yes      Intervention Provide education and explanation of THRR including how the numbers were predicted and where they are located for reference Provide education and explanation of THRR including how the numbers were predicted and where they are located for reference      Expected Outcomes Short Term: Able to state/look up THRR;Long Term: Able to use THRR to govern intensity when exercising independently;Short Term: Able to use daily as guideline for intensity in rehab Short Term: Able to state/look up THRR;Long Term: Able to use THRR to govern intensity when exercising independently;Short Term: Able to use daily as guideline for intensity in rehab      Understanding of Exercise Prescription Yes Yes      Intervention Provide education, explanation, and written materials on patient's individual exercise prescription Provide education, explanation, and written materials on patient's individual exercise prescription      Expected Outcomes Short Term: Able to explain program exercise prescription;Long Term: Able to explain home exercise prescription to exercise independently Short Term: Able to explain program exercise prescription;Long Term: Able to explain home exercise prescription to exercise independently               Exercise Goals Re-Evaluation :  Exercise Goals Re-Evaluation     Row Name 05/24/23 0846             Exercise Goal Re-Evaluation   Exercise Goals Review Increase Physical Activity;Able to understand and use Dyspnea scale;Understanding of Exercise Prescription;Increase Strength and Stamina;Knowledge and understanding of Target Heart Rate Range  (THRR);Able to understand and use rate of perceived exertion (RPE) scale       Comments Briyana has completed 1 day of exercise. She walked on TM for 15 min, 2.0 mph, 0 incline, 2.3 METs. She then exercised on the recumbent elliptical for 15 min, level 2, METs 1.3. tolerated well. Performed warm up and cool down unlimited. Will begin to progress as tolerated.       Expected Outcomes Through exercise at rehab and at home, the patient will decrease shortness of breath with daily activities and feel confident in carrying out an exercise regime at home.                Discharge Exercise Prescription (Final Exercise Prescription Changes):  Exercise Prescription Changes - 05/31/23 1500       Response to Exercise   Blood Pressure (Admit) 118/60    Blood Pressure (Exercise) 124/56    Blood Pressure (Exit) 110/60    Heart Rate (Admit) 85 bpm    Heart Rate (Exercise) 110 bpm    Heart Rate (Exit) 87 bpm    Oxygen Saturation (Admit) 95 %    Oxygen Saturation (Exercise) 94 %  Oxygen Saturation (Exit) 96 %    Rating of Perceived Exertion (Exercise) 11    Perceived Dyspnea (Exercise) 1    Duration Continue with 30 min of aerobic exercise without signs/symptoms of physical distress.    Intensity THRR unchanged      Progression   Progression Continue to progress workloads to maintain intensity without signs/symptoms of physical distress.      Resistance Training   Training Prescription Yes    Weight blue bands    Reps 10-15    Time 10 Minutes      Treadmill   MPH 2    Grade 0    Minutes 15    METs 2.53      Recumbant Elliptical   Level 2    Minutes 15    METs 2.4             Nutrition:  Target Goals: Understanding of nutrition guidelines, daily intake of sodium 1500mg , cholesterol 200mg , calories 30% from fat and 7% or less from saturated fats, daily to have 5 or more servings of fruits and vegetables.  Biometrics:  Pre Biometrics - 05/13/23 1444       Pre Biometrics    Grip Strength 20 kg              Nutrition Therapy Plan and Nutrition Goals:  Nutrition Therapy & Goals - 05/19/23 1406       Nutrition Therapy   Diet General Healthy Diet    Drug/Food Interactions Statins/Certain Fruits      Personal Nutrition Goals   Nutrition Goal Patient to improve diet quality by using the plate method as a guide for meal planning to include lean protein/plant protein, fruits, vegetables, whole grains, nonfat dairy as part of a well-balanced diet.    Comments Angelyne reports eating a wide variety of foods. She does crave carbs and she does not enjoy cooking. She reports some concern over recent weight gain of ~5-7#; she reports her typical weight is 112-116#. Will continue to monitor her weight as her current BMI is appropriate for age (43.9). Patient will benefit from participation in pulmonary rehab for nutrition, exercise, and lifestyle modification.      Intervention Plan   Intervention Prescribe, educate and counsel regarding individualized specific dietary modifications aiming towards targeted core components such as weight, hypertension, lipid management, diabetes, heart failure and other comorbidities.;Nutrition handout(s) given to patient.    Expected Outcomes Short Term Goal: Understand basic principles of dietary content, such as calories, fat, sodium, cholesterol and nutrients.;Long Term Goal: Adherence to prescribed nutrition plan.             Nutrition Assessments:  Nutrition Assessments - 05/19/23 1458       Rate Your Plate Scores   Pre Score 66            MEDIFICTS Score Key: ?70 Need to make dietary changes  40-70 Heart Healthy Diet ? 40 Therapeutic Level Cholesterol Diet  Flowsheet Row PULMONARY REHAB CHRONIC OBSTRUCTIVE PULMONARY DISEASE from [facility]/19/2023 in [facility]  Picture Your Plate Total Score on Admission 66      Picture Your Plate Scores: <78 Unhealthy dietary  pattern with much room for improvement. 41-50 Dietary pattern unlikely to meet recommendations for good health and room for improvement. 51-60 More healthful dietary pattern, with some room for improvement.  >60 Healthy dietary pattern, although there may be some specific behaviors that could be improved.  Nutrition Goals Re-Evaluation:  Nutrition Goals Re-Evaluation     Row Name 05/19/23 1406             Goals   Current Weight 122 lb 12.7 oz (55.7 kg)       Comment A1c WNL, lipids WNL       Expected Outcome Daveena reports eating a wide variety of foods. She does crave carbs and she does not enjoy cooking. She reports some concern over recent weight gain of ~5-7#; she reports her typical weight is 112-116#. Will continue to monitor her weight as her current BMI is appropriate for age (44.9). Patient will benefit from participation in pulmonary rehab for nutrition, exercise, and lifestyle modification.                Nutrition Goals Discharge (Final Nutrition Goals Re-Evaluation):  Nutrition Goals Re-Evaluation - 05/19/23 1406       Goals   Current Weight 122 lb 12.7 oz (55.7 kg)    Comment A1c WNL, lipids WNL    Expected Outcome Melisande reports eating a wide variety of foods. She does crave carbs and she does not enjoy cooking. She reports some concern over recent weight gain of ~5-7#; she reports her typical weight is 112-116#. Will continue to monitor her weight as her current BMI is appropriate for age (44.9). Patient will benefit from participation in pulmonary rehab for nutrition, exercise, and lifestyle modification.             Psychosocial: Target Goals: Acknowledge presence or absence of significant depression and/or stress, maximize coping skills, provide positive support system. Participant is able to verbalize types and ability to use techniques and skills needed for reducing stress and depression.  Initial Review & Psychosocial Screening:  Initial Psych Review &  Screening - 05/13/23 1316       Initial Review   Current issues with History of Depression;Current Psychotropic Meds      Family Dynamics   Good Support System? Yes    Comments Pt denies psychosocial barriers at this time      Barriers   Psychosocial barriers to participate in program There are no identifiable barriers or psychosocial needs.      Screening Interventions   Interventions Encouraged to exercise             Quality of Life Scores:  Scores of 19 and below usually indicate a poorer quality of life in these areas.  A difference of  2-3 points is a clinically meaningful difference.  A difference of 2-3 points in the total score of the Quality of Life Index has been associated with significant improvement in overall quality of life, self-image, physical symptoms, and general health in studies assessing change in quality of life.  PHQ-9: Review Flowsheet       05/13/2023  Depression screen PHQ 2/9  Decreased Interest 0  Down, Depressed, Hopeless 0  PHQ - 2 Score 0  Altered sleeping 1  Tired, decreased energy 1  Change in appetite 0  Feeling bad or failure about yourself  0  Trouble concentrating 0  Moving slowly or fidgety/restless 0  Suicidal thoughts 0  PHQ-9 Score 2  Difficult doing work/chores Not difficult at all    Details           Interpretation of Total Score  Total Score Depression Severity:  1-4 = Minimal depression, 5-9 = Mild depression, 10-14 = Moderate depression, 15-19 = Moderately severe depression, 20-27 = Severe depression   Psychosocial  Evaluation and Intervention:  Psychosocial Evaluation - 05/13/23 1318       Psychosocial Evaluation & Interventions   Interventions Encouraged to exercise with the program and follow exercise prescription    Comments Kaselyn denies any psychosocial barriers or concerns at this time.    Expected Outcomes For Tishie to participate in PR free of any psychosocial barriers or concerns    Continue  Psychosocial Services  No Follow up required             Psychosocial Re-Evaluation:  Psychosocial Re-Evaluation     Row Name 05/25/23 [phone]             Psychosocial Re-Evaluation   Current issues with Current Depression;Current Psychotropic Meds       Comments Asly denies any psychosocial barriers or concerns at this time. She denies any needs.       Expected Outcomes For Zineb to continue to attend PR without any psychosocial barriers or concerns       Interventions Encouraged to attend Pulmonary Rehabilitation for the exercise       Continue Psychosocial Services  No Follow up required                Psychosocial Discharge (Final Psychosocial Re-Evaluation):  Psychosocial Re-Evaluation - 05/25/23 0953       Psychosocial Re-Evaluation   Current issues with Current Depression;Current Psychotropic Meds    Comments Lougene denies any psychosocial barriers or concerns at this time. She denies any needs.    Expected Outcomes For Marcellina to continue to attend PR without any psychosocial barriers or concerns    Interventions Encouraged to attend Pulmonary Rehabilitation for the exercise    Continue Psychosocial Services  No Follow up required             Education: Education Goals: Education classes will be provided on a weekly basis, covering required topics. Participant will state understanding/return demonstration of topics presented.  Learning Barriers/Preferences:  Learning Barriers/Preferences - 05/13/23 1318       Learning Barriers/Preferences   Learning Barriers None    Learning Preferences Group Instruction;Skilled Demonstration;Individual Instruction             Education Topics: Introduction to Pulmonary Rehab Group instruction provided by PowerPoint, verbal discussion, and written material to support subject matter. Instructor reviews what Pulmonary Rehab is, the purpose of the program, and how patients are referred.     Know Your Numbers Group  instruction that is supported by a PowerPoint presentation. Instructor discusses importance of knowing and understanding resting, exercise, and post-exercise oxygen saturation, heart rate, and blood pressure. Oxygen saturation, heart rate, blood pressure, rating of perceived exertion, and dyspnea are reviewed along with a normal range for these values.    Exercise for the Pulmonary Patient Group instruction that is supported by a PowerPoint presentation. Instructor discusses benefits of exercise, core components of exercise, frequency, duration, and intensity of an exercise routine, importance of utilizing pulse oximetry during exercise, safety while exercising, and options of places to exercise outside of rehab.       MET Level  Group instruction provided by PowerPoint, verbal discussion, and written material to support subject matter. Instructor reviews what METs are and how to increase METs.  Flowsheet Row PULMONARY REHAB CHRONIC OBSTRUCTIVE PULMONARY DISEASE from 05/26/2023 in [facility]  Date 05/26/23  Educator EP  Instruction Review Code 1- Verbalizes Understanding       Pulmonary Medications Verbally interactive  group education provided by instructor with focus on inhaled medications and proper administration.   Anatomy and Physiology of the Respiratory System Group instruction provided by PowerPoint, verbal discussion, and written material to support subject matter. Instructor reviews respiratory cycle and anatomical components of the respiratory system and their functions. Instructor also reviews differences in obstructive and restrictive respiratory diseases with examples of each.    Oxygen Safety Group instruction provided by PowerPoint, verbal discussion, and written material to support subject matter. There is an overview of "What is Oxygen" and "Why do we need it".  Instructor also reviews how to create a safe  environment for oxygen use, the importance of using oxygen as prescribed, and the risks of noncompliance. There is a brief discussion on traveling with oxygen and resources the patient may utilize.   Oxygen Use Group instruction provided by PowerPoint, verbal discussion, and written material to discuss how supplemental oxygen is prescribed and different types of oxygen supply systems. Resources for more information are provided.    Breathing Techniques Group instruction that is supported by demonstration and informational handouts. Instructor discusses the benefits of pursed lip and diaphragmatic breathing and detailed demonstration on how to perform both.     Risk Factor Reduction Group instruction that is supported by a PowerPoint presentation. Instructor discusses the definition of a risk factor, different risk factors for pulmonary disease, and how the heart and lungs work together. Flowsheet Row PULMONARY REHAB CHRONIC OBSTRUCTIVE PULMONARY DISEASE from 05/19/2023 in [facility], V[facility], & Lung Health  Date 05/19/23  Educator E P  Instruction Review Code 1- Verbalizes Understanding       MD Day A group question and answer session with a medical doctor that allows participants to ask questions that relate to their pulmonary disease state.   Nutrition for the Pulmonary Patient Group instruction provided by PowerPoint slides, verbal discussion, and written materials to support subject matter. The instructor gives an explanation and review of healthy diet recommendations, which includes a discussion on weight management, recommendations for fruit and vegetable consumption, as well as protein, fluid, caffeine, fiber, sodium, sugar, and alcohol. Tips for eating when patients are short of breath are discussed.    Other Education Group or individual verbal, written, or video instructions that support the educational goals of the pulmonary rehab program.     Knowledge Questionnaire Score:  Knowledge Questionnaire Score - 05/13/23 1439       Knowledge Questionnaire Score   Pre Score 17/18             Core Components/Risk Factors/Patient Goals at Admission:  Personal Goals and Risk Factors at Admission - 05/13/23 1319       Core Components/Risk Factors/Patient Goals on Admission    Weight Management Yes;Weight Loss    Intervention Weight Management: Develop a combined nutrition and exercise program designed to reach desired caloric intake, while maintaining appropriate intake of nutrient and fiber, sodium and fats, and appropriate energy expenditure required for the weight goal.;Weight Management: Provide education and appropriate resources to help participant work on and attain dietary goals.;Weight Management/Obesity: Establish reasonable short term and long term weight goals.;Obesity: Provide education and appropriate resources to help participant work on and attain dietary goals.    Expected Outcomes Short Term: Continue to assess and modify interventions until short term weight is achieved;Long Term: Adherence to nutrition and physical activity/exercise program aimed toward attainment of established weight goal;Weight Maintenance: Understanding of the daily nutrition guidelines, which includes 25-35% calories  from fat, 7% or less cal from saturated fats, less than 200mg  cholesterol, less than 1.5gm of sodium, & 5 or more servings of fruits and vegetables daily;Weight Loss: Understanding of general recommendations for a balanced deficit meal plan, which promotes 1-2 lb weight loss per week and includes a negative energy balance of [phone] kcal/d;Understanding recommendations for meals to include 15-35% energy as protein, 25-35% energy from fat, 35-60% energy from carbohydrates, less than 200mg  of dietary cholesterol, 20-35 gm of total fiber daily;Understanding of distribution of calorie intake throughout the day with the consumption of 4-5  meals/snacks;Weight Gain: Understanding of general recommendations for a high calorie, high protein meal plan that promotes weight gain by distributing calorie intake throughout the day with the consumption for 4-5 meals, snacks, and/or supplements    Tobacco Cessation Yes    Number of packs per day Krystina states she still considers herself a smoker even though she has not had a cigarette in 17 weeks   Neima has not smoked in 17 weeks   Intervention Assist the participant in steps to quit. Provide individualized education and counseling about committing to Tobacco Cessation, relapse prevention, and pharmacological support that can be provided by physician.;Education officer, environmental, assist with locating and accessing local/national Quit Smoking programs, and support quit date choice.    Expected Outcomes Short Term: Will demonstrate readiness to quit, by selecting a quit date.;Short Term: Will quit all tobacco product use, adhering to prevention of relapse plan.;Long Term: Complete abstinence from all tobacco products for at least 12 months from quit date.    Improve shortness of breath with ADL's Yes    Intervention Provide education, individualized exercise plan and daily activity instruction to help decrease symptoms of SOB with activities of daily living.    Expected Outcomes Short Term: Improve cardiorespiratory fitness to achieve a reduction of symptoms when performing ADLs;Long Term: Be able to perform more ADLs without symptoms or delay the onset of symptoms             Core Components/Risk Factors/Patient Goals Review:   Goals and Risk Factor Review     Row Name 05/25/23 0956             Core Components/Risk Factors/Patient Goals Review   Personal Goals Review Weight Management/Obesity;Tobacco Cessation;Improve shortness of breath with ADL's;Develop more efficient breathing techniques such as purse lipped breathing and diaphragmatic breathing and practicing self-pacing with  activity.       Review This is Fajr's first week in the program. Unable to assess her goal of weight loss yet. Delainee is working with our dietician to lower calorie consumption and eat a variety of healthy foods. Jaelani's goals are progressing on decreasing her shortness of breath with ADLs and developing more efficient breathing techniques such as purse lipped breathing and diaphragmatic breathing; and practicing self-pacing with activity. We are teaching her these techniques and she is practicing them in class. Devanie's goal to quit smoking is progressing. She states she quit smoking 17 weeks ago, but still considers herself a smoker. We are continuing to support her with [facility] materials.       Expected Outcomes For Sonoma to lose weight, quit smoking, improve her shortness of breath with ADLs, and develop more efficient breathing techniques.                Core Components/Risk Factors/Patient Goals at Discharge (Final Review):   Goals and Risk Factor Review - 05/25/23 0956       Core Components/Risk Factors/Patient  Goals Review   Personal Goals Review Weight Management/Obesity;Tobacco Cessation;Improve shortness of breath with ADL's;Develop more efficient breathing techniques such as purse lipped breathing and diaphragmatic breathing and practicing self-pacing with activity.    Review This is Kennah's first week in the program. Unable to assess her goal of weight loss yet. Dagney is working with our dietician to lower calorie consumption and eat a variety of healthy foods. Latronda's goals are progressing on decreasing her shortness of breath with ADLs and developing more efficient breathing techniques such as purse lipped breathing and diaphragmatic breathing; and practicing self-pacing with activity. We are teaching her these techniques and she is practicing them in class. Wilba's goal to quit smoking is progressing. She states she quit smoking 17 weeks ago, but still considers herself a smoker. We are  continuing to support her with [facility] materials.    Expected Outcomes For Dorca to lose weight, quit smoking, improve her shortness of breath with ADLs, and develop more efficient breathing techniques.             ITP Comments:Pt is making expected progress toward Pulmonary Rehab goals after completing 4 sessions. Recommend continued exercise, life style modification, education, and utilization of breathing techniques to increase stamina and strength, while also decreasing shortness of breath with exertion.  Dr. Mechele Collin is Medical Director for Pulmonary Rehab at [facility].     Comments: Dr. Mechele Collin is Medical Director for Pulmonary Rehab at [facility].

## 2023-06-02 ENCOUNTER — Encounter (HOSPITAL_COMMUNITY)
Admission: RE | Admit: 2023-06-02 | Discharge: 2023-06-02 | Disposition: A | Discharge status: Home / Self Care | Payer: Medicare HMO | Source: Ambulatory Visit | Attending: Pulmonary Disease | Admitting: Pulmonary Disease

## 2023-06-02 DIAGNOSIS — Z5189 Encounter for other specified aftercare: Secondary | ICD-10-CM | POA: Diagnosis not present

## 2023-06-02 DIAGNOSIS — J449 Chronic obstructive pulmonary disease, unspecified: Secondary | ICD-10-CM

## 2023-06-02 DIAGNOSIS — F17210 Nicotine dependence, cigarettes, uncomplicated: Secondary | ICD-10-CM | POA: Diagnosis not present

## 2023-06-02 NOTE — Progress Notes (Signed)
Daily Session Note  Patient Details  Name: Sarah Harrell MRN: [id number] Date of Birth: 12/05/1940 Referring Provider:   Doristine Devoid Pulmonary Rehab Walk Test from 05/13/2023 in [facility], V[facility], [facility]  Referring Provider Dewald       Encounter Date: 06/02/2023  Check In:  Session Check In - 06/02/23 1354       Check-In   Supervising physician immediately available to respond to emergencies [facility] MD immediately available    Physician(s) Metta Clines, NP    Location [facility]    Staff Present Raford Pitcher, MS, ACSM-CEP, Exercise Physiologist;Casey Erin Sons BS, ACSM-CEP, Exercise Physiologist;Mary Gerre Scull, RN, BSN    Virtual Visit No    Medication changes reported     No    Fall or balance concerns reported    No    Tobacco Cessation No Change    Warm-up and Cool-down Performed as group-led instruction    Resistance Training Performed Yes    VAD Patient? No    PAD/SET Patient? No      Pain Assessment   Currently in Pain? No/denies    Multiple Pain Sites No             Capillary Blood Glucose: No results found for this or any previous visit (from the past 24 hour(s)).    Social History   Tobacco Use  Smoking Status Every Day   Types: Cigarettes  Smokeless Tobacco Never  Tobacco Comments   Pt has not smoked for 17 week. She does consider herself a smoker    Goals Met:  Independence with exercise equipment Exercise tolerated well No report of concerns or symptoms today Strength training completed today  Goals Unmet:  Not Applicable  Comments: Service time is from 1307 to 1452.    Dr. Mechele Collin is Medical Director for Pulmonary Rehab at [facility].

## 2023-06-07 ENCOUNTER — Telehealth (HOSPITAL_COMMUNITY): Payer: Self-pay

## 2023-06-07 ENCOUNTER — Encounter (HOSPITAL_COMMUNITY)
Admission: RE | Admit: 2023-06-07 | Discharge: 2023-06-07 | Disposition: A | Discharge status: Home / Self Care | Payer: Medicare HMO | Source: Ambulatory Visit | Attending: Pulmonary Disease | Admitting: Pulmonary Disease

## 2023-06-07 DIAGNOSIS — Z5189 Encounter for other specified aftercare: Secondary | ICD-10-CM | POA: Diagnosis not present

## 2023-06-07 DIAGNOSIS — F17210 Nicotine dependence, cigarettes, uncomplicated: Secondary | ICD-10-CM | POA: Diagnosis not present

## 2023-06-07 DIAGNOSIS — J449 Chronic obstructive pulmonary disease, unspecified: Secondary | ICD-10-CM | POA: Diagnosis not present

## 2023-06-07 NOTE — Telephone Encounter (Signed)
Dr. Francine Graven, Can we have an increase in Astrid's target heart rate to 130 for exercise in PR?

## 2023-06-07 NOTE — Progress Notes (Signed)
Daily Session Note  Patient Details  Name: Sarah Harrell MRN: [id number] Date of Birth: 12-Dec-1940 Referring Provider:   Doristine Devoid Pulmonary Rehab Walk Test from 05/13/2023 in [facility] for Heart, Vascular, & Lung Health  Referring Provider Dewald       Encounter Date: 06/07/2023  Check In:  Session Check In - 06/07/23 1458       Check-In   Supervising physician immediately available to respond to emergencies [facility] MD immediately available    Physician(s) Metta Clines, NP    [facility]    Staff Present Samantha Belarus, RD, Dutch Gray, RN, BSN;Randi Idelle Crouch BS, ACSM-CEP, Exercise Physiologist;Kaylee Earlene Plater, MS, ACSM-CEP, Exercise Physiologist;Sherril Shipman Katrinka Blazing, RT    Virtual Visit No    Medication changes reported     No    Fall or balance concerns reported    No    Tobacco Cessation No Change    Warm-up and Cool-down Performed as group-led instruction    Resistance Training Performed Yes    VAD Patient? No    PAD/SET Patient? No      Pain Assessment   Currently in Pain? No/denies    Multiple Pain Sites No             Capillary Blood Glucose: No results found for this or any previous visit (from the past 24 hour(s)).    Social History   Tobacco Use  Smoking Status Every Day   Types: Cigarettes  Smokeless Tobacco Never  Tobacco Comments   Pt has not smoked for 17 week. She does consider herself a smoker    Goals Met:  Proper associated with RPD/PD & O2 Sat Independence with exercise equipment Exercise tolerated well No report of concerns or symptoms today Strength training completed today  Goals Unmet:  Not Applicable  Comments: Service time is from 1315 to 1446.    Dr. Mechele Collin is Medical Director for Pulmonary Rehab at [facility].

## 2023-06-09 ENCOUNTER — Encounter (HOSPITAL_COMMUNITY)
Admission: RE | Admit: 2023-06-09 | Discharge: 2023-06-09 | Disposition: A | Discharge status: Home / Self Care | Payer: Medicare HMO | Source: Ambulatory Visit | Attending: Pulmonary Disease | Admitting: Pulmonary Disease

## 2023-06-09 DIAGNOSIS — J449 Chronic obstructive pulmonary disease, unspecified: Secondary | ICD-10-CM | POA: Diagnosis not present

## 2023-06-09 DIAGNOSIS — Z5189 Encounter for other specified aftercare: Secondary | ICD-10-CM | POA: Diagnosis not present

## 2023-06-09 DIAGNOSIS — F17210 Nicotine dependence, cigarettes, uncomplicated: Secondary | ICD-10-CM | POA: Diagnosis not present

## 2023-06-09 NOTE — Progress Notes (Signed)
Daily Session Note  Patient Details  Name: Sarah Harrell MRN: [id number] Date of Birth: 10/04/1940 Referring Provider:   Doristine Devoid Pulmonary Rehab Walk Test from 05/13/2023 in [facility] for Heart, Vascular, & Lung Health  Referring Provider Dewald       Encounter Date: 06/09/2023  Check In:  Session Check In - 06/09/23 1338       Check-In   Supervising physician immediately available to respond to emergencies [facility] MD immediately available    Physician(s) Bernadene Person, NP    [facility]    Staff Present Samantha Belarus, RD, Dutch Gray, RN, BSN;Randi Reeve BS, ACSM-CEP, Exercise Physiologist;Kaylee Earlene Plater, MS, ACSM-CEP, Exercise Physiologist;Khalee Mazo Katrinka Blazing, RT    Virtual Visit No    Medication changes reported     No    Fall or balance concerns reported    No    Tobacco Cessation No Change    Warm-up and Cool-down Performed as group-led instruction    Resistance Training Performed Yes    VAD Patient? No    PAD/SET Patient? No      Pain Assessment   Currently in Pain? No/denies    Multiple Pain Sites No             Capillary Blood Glucose: No results found for this or any previous visit (from the past 24 hour(s)).    Social History   Tobacco Use  Smoking Status Every Day   Types: Cigarettes  Smokeless Tobacco Never  Tobacco Comments   Pt has not smoked for 17 week. She does consider herself a smoker    Goals Met:  Proper associated with RPD/PD & O2 Sat Independence with exercise equipment Exercise tolerated well No report of concerns or symptoms today Strength training completed today  Goals Unmet:  Not Applicable  Comments: Service time is from 1314 to 1446.    Dr. Mechele Collin is Medical Director for Pulmonary Rehab at [facility].

## 2023-06-10 ENCOUNTER — Telehealth: Payer: Self-pay | Admitting: Internal Medicine

## 2023-06-14 ENCOUNTER — Encounter (HOSPITAL_COMMUNITY)
Admission: RE | Admit: 2023-06-14 | Discharge: 2023-06-14 | Disposition: A | Discharge status: Home / Self Care | Payer: Medicare HMO | Source: Ambulatory Visit | Attending: Pulmonary Disease

## 2023-06-14 ENCOUNTER — Encounter (HOSPITAL_COMMUNITY): Payer: Self-pay

## 2023-06-14 VITALS — Wt 127.0 lb

## 2023-06-14 DIAGNOSIS — J449 Chronic obstructive pulmonary disease, unspecified: Secondary | ICD-10-CM

## 2023-06-14 DIAGNOSIS — F17210 Nicotine dependence, cigarettes, uncomplicated: Secondary | ICD-10-CM | POA: Diagnosis not present

## 2023-06-14 DIAGNOSIS — Z5189 Encounter for other specified aftercare: Secondary | ICD-10-CM | POA: Diagnosis not present

## 2023-06-14 NOTE — Progress Notes (Signed)
Daily Session Note  Patient Details  Name: Sarah Harrell MRN: [id number] Date of Birth: 05-23-1941 Referring Provider:   Doristine Devoid Pulmonary Rehab Walk Test from 05/13/2023 in [facility] for [facility], Vascular, & Lung Health  Referring Provider Dewald       Encounter Date: 06/14/2023  Check In:  Session Check In - 06/14/23 1351       Check-In   Supervising physician immediately available to respond to emergencies [facility] MD immediately available    Physician(s) Jari Favre, NP    [facility]    Staff Present Samantha Belarus, RD, Dutch Gray, RN, BSN;Randi Reeve BS, ACSM-CEP, Exercise Physiologist;Kaylee Earlene Plater, MS, ACSM-CEP, Exercise Physiologist;Casey Katrinka Blazing, RT    Virtual Visit No    Medication changes reported     No    Fall or balance concerns reported    No    Tobacco Cessation No Change    Warm-up and Cool-down Performed as group-led instruction    Resistance Training Performed Yes    VAD Patient? No    PAD/SET Patient? No      Pain Assessment   Currently in Pain? No/denies    Multiple Pain Sites No             Capillary Blood Glucose: No results found for this or any previous visit (from the past 24 hour(s)).   Exercise Prescription Changes - 06/14/23 1500       Response to Exercise   Blood Pressure (Admit) 102/58    Blood Pressure (Exercise) 110/62    Blood Pressure (Exit) 112/64    Heart Rate (Admit) 89 bpm    Heart Rate (Exercise) 109 bpm    Heart Rate (Exit) 95 bpm    Oxygen Saturation (Admit) 95 %    Oxygen Saturation (Exercise) 91 %    Oxygen Saturation (Exit) 93 %    Rating of Perceived Exertion (Exercise) 11    Perceived Dyspnea (Exercise) 2    Duration Continue with 30 min of aerobic exercise without signs/symptoms of physical distress.    Intensity THRR unchanged      Progression   Progression Continue to progress workloads to maintain intensity without signs/symptoms of physical distress.       Resistance Training   Training Prescription Yes    Weight blue bands    Reps 10-15    Time 10 Minutes      Treadmill   MPH 2.2    Grade 1    Minutes 15    METs 2.99      Recumbant Elliptical   Level 3    Minutes 15    METs 2.9             Social History   Tobacco Use  Smoking Status Every Day   Types: Cigarettes  Smokeless Tobacco Never  Tobacco Comments   Pt has not smoked for 17 week. She does consider herself a smoker    Goals Met:  Independence with exercise equipment Exercise tolerated well No report of concerns or symptoms today Strength training completed today  Goals Unmet:  Not Applicable  Comments: Service time is from 1311 to 1448    Dr. Mechele Collin is Medical Director for Pulmonary Rehab at [facility].

## 2023-06-16 ENCOUNTER — Encounter (HOSPITAL_COMMUNITY)
Admission: RE | Admit: 2023-06-16 | Discharge: 2023-06-16 | Disposition: A | Discharge status: Home / Self Care | Payer: Medicare HMO | Source: Ambulatory Visit | Attending: Pulmonary Disease | Admitting: Pulmonary Disease

## 2023-06-16 DIAGNOSIS — J449 Chronic obstructive pulmonary disease, unspecified: Secondary | ICD-10-CM | POA: Diagnosis not present

## 2023-06-16 DIAGNOSIS — Z5189 Encounter for other specified aftercare: Secondary | ICD-10-CM | POA: Diagnosis not present

## 2023-06-16 DIAGNOSIS — F17210 Nicotine dependence, cigarettes, uncomplicated: Secondary | ICD-10-CM | POA: Diagnosis not present

## 2023-06-16 NOTE — Progress Notes (Signed)
Daily Session Note  Patient Details  Name: Sarah Harrell MRN: [id number] Date of Birth: 06-30-1941 Referring Provider:   Doristine Devoid Pulmonary Rehab Walk Test from 05/13/2023 in [facility] for Heart, Vascular, & Lung Health  Referring Provider Dewald       Encounter Date: 06/16/2023  Check In:  Session Check In - 06/16/23 1432       Check-In   Supervising physician immediately available to respond to emergencies [facility] MD immediately available    Physician(s) Jari Favre, NP    [facility]    Staff Present Essie Hart, RN, BSN;Randi Idelle Crouch BS, ACSM-CEP, Exercise Physiologist;Fahima Cifelli Earlene Plater, MS, ACSM-CEP, Exercise Physiologist;Casey Katrinka Blazing, RT    Virtual Visit No    Medication changes reported     No    Fall or balance concerns reported    No    Tobacco Cessation No Change    Warm-up and Cool-down Performed as group-led instruction    Resistance Training Performed Yes    VAD Patient? No    PAD/SET Patient? No      Pain Assessment   Currently in Pain? No/denies    Multiple Pain Sites No             Capillary Blood Glucose: No results found for this or any previous visit (from the past 24 hour(s)).    Social History   Tobacco Use  Smoking Status Every Day   Types: Cigarettes  Smokeless Tobacco Never  Tobacco Comments   Pt has not smoked for 17 week. She does consider herself a smoker    Goals Met:  Proper associated with RPD/PD & O2 Sat Exercise tolerated well No report of concerns or symptoms today Strength training completed today  Goals Unmet:  Not Applicable  Comments: Service time is from 1318 to 1456.    Dr. Mechele Collin is Medical Director for Pulmonary Rehab at [facility].

## 2023-06-17 NOTE — Telephone Encounter (Signed)
w

## 2023-06-21 ENCOUNTER — Encounter (HOSPITAL_COMMUNITY)
Admission: RE | Admit: 2023-06-21 | Discharge: 2023-06-21 | Disposition: A | Discharge status: Home / Self Care | Payer: Medicare HMO | Source: Ambulatory Visit | Attending: Pulmonary Disease | Admitting: Pulmonary Disease

## 2023-06-21 DIAGNOSIS — Z5189 Encounter for other specified aftercare: Secondary | ICD-10-CM | POA: Diagnosis not present

## 2023-06-21 DIAGNOSIS — J449 Chronic obstructive pulmonary disease, unspecified: Secondary | ICD-10-CM

## 2023-06-21 DIAGNOSIS — F17210 Nicotine dependence, cigarettes, uncomplicated: Secondary | ICD-10-CM | POA: Diagnosis not present

## 2023-06-21 NOTE — Progress Notes (Signed)
Daily Session Note  Patient Details  Name: Sarah Harrell MRN: [id number] Date of Birth: 10/23/1940 Referring Provider:   Doristine Devoid Pulmonary Rehab Walk Test from 05/13/2023 in [facility]  Referring Provider Dewald       Encounter Date: 06/21/2023  Check In:  Session Check In - 06/21/23 1322       Check-In   Supervising physician immediately available to respond to emergencies [facility] MD immediately available    Physician(s) Carlyon Shadow, NP    Location [facility]    Staff Present Essie Hart, RN, BSN;Randi Idelle Crouch BS, ACSM-CEP, Exercise Physiologist;Westin Knotts Earlene Plater, MS, ACSM-CEP, Exercise Physiologist;Casey Katrinka Blazing, RT    Virtual Visit No    Medication changes reported     No    Fall or balance concerns reported    No    Tobacco Cessation No Change    Warm-up and Cool-down Performed as group-led instruction    Resistance Training Performed Yes    VAD Patient? No    PAD/SET Patient? No      Pain Assessment   Currently in Pain? No/denies    Multiple Pain Sites No             Capillary Blood Glucose: No results found for this or any previous visit (from the past 24 hour(s)).    Social History   Tobacco Use  Smoking Status Every Day   Types: Cigarettes  Smokeless Tobacco Never  Tobacco Comments   Pt has not smoked for 17 week. She does consider herself a smoker    Goals Met:  Proper associated with RPD/PD & O2 Sat Exercise tolerated well No report of concerns or symptoms today Strength training completed today  Goals Unmet:  Not Applicable  Comments: Service time is from 1308 to 1437.    Dr. Mechele Collin is Medical Director for Pulmonary Rehab at [facility].

## 2023-06-23 ENCOUNTER — Encounter (HOSPITAL_COMMUNITY)
Admission: RE | Admit: 2023-06-23 | Discharge: 2023-06-23 | Disposition: A | Discharge status: Home / Self Care | Payer: Medicare HMO | Source: Ambulatory Visit | Attending: Pulmonary Disease | Admitting: Pulmonary Disease

## 2023-06-23 DIAGNOSIS — F17210 Nicotine dependence, cigarettes, uncomplicated: Secondary | ICD-10-CM | POA: Diagnosis not present

## 2023-06-23 DIAGNOSIS — J449 Chronic obstructive pulmonary disease, unspecified: Secondary | ICD-10-CM | POA: Diagnosis not present

## 2023-06-23 DIAGNOSIS — Z5189 Encounter for other specified aftercare: Secondary | ICD-10-CM | POA: Diagnosis not present

## 2023-06-23 NOTE — Progress Notes (Signed)
Home Exercise Prescription I have reviewed a Home Exercise Prescription with Sarah Harrell. Pt is currently dancing one hour x2 a week on nonrehab days. Encouraged pt to continue. We also talked about what she will do when she graduates from pulmonary rehab. Pt is checking in to [employer] [facility] for when she graduates to take the place of PR. The patient stated that their goals were to keep going and keep exercising. We reviewed exercise guidelines, target heart rate during exercise, RPE Scale, weather conditions, endpoints for exercise, warmup and cool down. The patient is encouraged to come to me with any questions. I will continue to follow up with the patient to assist them with progression and safety.  Spent 15 min discussing home exercise plan and goals.  Ayda Tancredi [place], [place], ACSM-CEP 06/23/2023 3:37 PM

## 2023-06-23 NOTE — Progress Notes (Signed)
Daily Session Note  Patient Details  Name: Sarah Harrell MRN: [id number] Date of Birth: 1941/06/07 Referring Provider:   Doristine Devoid Pulmonary Rehab Walk Test from 05/13/2023 in [facility], [facility], & [facility]  Referring Provider Dewald       Encounter Date: 06/23/2023  Check In:  Session Check In - 06/23/23 1321       Check-In   Supervising physician immediately available to respond to emergencies [facility] MD immediately available    Physician(s) Edd Fabian, NP    Location [facility]    Staff Present Essie Hart, RN, BSN;Randi Idelle Crouch BS, ACSM-CEP, Exercise Physiologist;[place] Earlene Plater, MS, ACSM-CEP, Exercise Physiologist;Casey Katrinka Blazing, RT    Virtual Visit No    Medication changes reported     No    Fall or balance concerns reported    No    Tobacco Cessation No Change    Warm-up and Cool-down Performed as group-led instruction    Resistance Training Performed Yes    VAD Patient? No    PAD/SET Patient? No      Pain Assessment   Currently in Pain? No/denies    Multiple Pain Sites No             Capillary Blood Glucose: No results found for this or any previous visit (from the past 24 hour(s)).    Social History   Tobacco Use  Smoking Status Every Day   Types: Cigarettes  Smokeless Tobacco Never  Tobacco Comments   Pt has not smoked for 17 week. She does consider herself a smoker    Goals Met:  Proper associated with RPD/PD & O2 Sat [employer] with exercise equipment Exercise tolerated well No report of concerns or symptoms today Strength training completed today  Goals Unmet:  Not Applicable  Comments: Service time is from 1311 to 1440.    Dr. Mechele Collin is Medical Director for Pulmonary Rehab at [facility].

## 2023-06-27 DIAGNOSIS — H5213 Myopia, bilateral: Secondary | ICD-10-CM | POA: Diagnosis not present

## 2023-06-28 ENCOUNTER — Encounter (HOSPITAL_COMMUNITY)
Admission: RE | Admit: 2023-06-28 | Discharge: 2023-06-28 | Disposition: A | Discharge status: Home / Self Care | Payer: Medicare HMO | Source: Ambulatory Visit | Attending: Pulmonary Disease | Admitting: Pulmonary Disease

## 2023-06-28 VITALS — Wt 126.1 lb

## 2023-06-28 DIAGNOSIS — J449 Chronic obstructive pulmonary disease, unspecified: Secondary | ICD-10-CM | POA: Insufficient documentation

## 2023-06-28 NOTE — Progress Notes (Signed)
Daily Session Note  Patient Details  Name: Sarah Harrell MRN: [id number] Date of Birth: 10/01/1940 Referring Provider:   Doristine Devoid Pulmonary Rehab Walk Test from 05/13/2023 in [facility] for [facility], Vascular, & Lung [facility]  Referring Provider Dewald       Encounter Date: 06/28/2023  Check In:  Session Check In - 06/28/23 1328       Check-In   Supervising physician immediately available to respond to emergencies [facility] MD immediately available    Physician(s) Robin Searing, NP    [facility]    Staff Present Essie Hart, RN, BSN;Randi Idelle Crouch BS, ACSM-CEP, Exercise Physiologist;Kaylee Earlene Plater, MS, ACSM-CEP, Exercise Physiologist;Mykelle Cockerell Hermine Messick [place], RD, LDN    Virtual Visit No    Medication changes reported     No    Fall or balance concerns reported    No    Tobacco Cessation No Change    Warm-up and Cool-down Performed as group-led instruction    Resistance Training Performed Yes    VAD Patient? No    PAD/SET Patient? No      Pain Assessment   Currently in Pain? No/denies    Multiple Pain Sites No             Capillary Blood Glucose: No results found for this or any previous visit (from the past 24 hour(s)).   Exercise Prescription Changes - 06/28/23 1500       Response to Exercise   Blood Pressure (Admit) 108/60    Blood Pressure (Exercise) 120/60    Blood Pressure (Exit) 108/66    Heart Rate (Admit) 93 bpm    Heart Rate (Exercise) 120 bpm    Heart Rate (Exit) 96 bpm    Oxygen Saturation (Admit) 95 %    Oxygen Saturation (Exercise) 89 %    Oxygen Saturation (Exit) 93 %    Rating of Perceived Exertion (Exercise) 13    Perceived Dyspnea (Exercise) 3    Duration Continue with 30 min of aerobic exercise without signs/symptoms of physical distress.    Intensity THRR unchanged      Progression   Progression Continue to progress workloads to maintain intensity without signs/symptoms of physical distress.       Resistance Training   Training Prescription Yes    Weight blue bands    Reps 10-15    Time 10 Minutes      Treadmill   MPH 2.4    Grade 2    Minutes 15    METs 3.5      Recumbant Elliptical   Level 3    Minutes 15    METs 3.6             Social History   Tobacco Use  Smoking Status Every Day   Types: Cigarettes  Smokeless Tobacco Never  Tobacco Comments   Pt has not smoked for 17 week. She does consider herself a smoker    Goals Met:  Proper associated with RPD/PD & O2 Sat [employer] with exercise equipment Exercise tolerated well No report of concerns or symptoms today Strength training completed today  Goals Unmet:  O2 Sat  Comments: Service time is from 1317 to 1440.    Dr. Mechele Collin is Medical Director for Pulmonary Rehab at [facility].

## 2023-06-29 NOTE — Progress Notes (Signed)
Pulmonary Individual Treatment Plan  Patient Details  Name: Sarah Harrell MRN: [id number] Date of Birth: 05-28-1941 Referring Provider:   Doristine Devoid Pulmonary Rehab Walk Test from 05/13/2023 in [facility]  Referring Provider Dewald       Initial Encounter Date:  Flowsheet Row Pulmonary Rehab Walk Test from 05/13/2023 in [facility]  Date 05/13/23       Visit Diagnosis: Stage 3 severe COPD by GOLD classification (HCC)  Patient's Home Medications on Admission:   Current Outpatient Medications:    albuterol (VENTOLIN HFA) 108 (90 Base) MCG/ACT inhaler, Inhale 1-2 puffs into the lungs every 6 (six) hours as needed., Disp: , Rfl:    amLODipine (NORVASC) 5 MG tablet, Take 5 mg by mouth daily., Disp: , Rfl:    citalopram (CELEXA) 20 MG tablet, Take 20 mg by mouth daily., Disp: , Rfl:    ECHINACEA PO, , Disp: , Rfl:    Multiple Vitamin (MULTIVITAMIN) tablet, Take 1 tablet by mouth daily., Disp: , Rfl:    rosuvastatin (CRESTOR) 10 MG tablet, Take 10 mg by mouth daily., Disp: , Rfl:    traZODone (DESYREL) 100 MG tablet, Take 50 mg by mouth at bedtime., Disp: , Rfl:    umeclidinium-vilanterol (ANORO ELLIPTA) 62.5-25 MCG/ACT AEPB, Inhale 1 puff into the lungs daily., Disp: 60 each, Rfl: 5  Past Medical History: Past Medical History:  Diagnosis Date   Depressive disorder, not elsewhere classified    Elevated fasting glucose    Glucose 110 as of 09/13/13   Fatigue    Guaiac positive stools    Normal colonoscopy 2009   Insomnia, unspecified    Joint pain    Mixed hyperlipidemia    Systemic sclerosis (HCC)    Tobacco dependence    Vitamin D deficiency     Tobacco Use: Social History   Tobacco Use  Smoking Status Every Day   Types: Cigarettes  Smokeless Tobacco Never  Tobacco Comments   Pt has not smoked for 17 week. She does consider herself a smoker     Labs: Review Flowsheet        No data to display          Capillary Blood Glucose: No results found for: "GLUCAP"   Pulmonary Assessment Scores:  Pulmonary Assessment Scores     Row Name 05/13/23 1439         ADL UCSD   ADL Phase Entry     SOB Score total 23       CAT Score   CAT Score 12       mMRC Score   mMRC Score 3             UCSD: Self-administered rating of dyspnea associated with activities of daily living (ADLs) 6-point scale (0 = "not at all" to 5 = "maximal or unable to do because of breathlessness")  Scoring Scores range from 0 to 120.  Minimally important difference is 5 units  CAT: CAT can identify the health impairment of COPD patients and is better correlated with disease progression.  CAT has a scoring range of zero to 40. The CAT score is classified into four groups of low (less than 10), medium (10 - 20), high (21-30) and very high (31-40) based on the impact level of disease on health status. A CAT score over 10 suggests significant symptoms.  A worsening CAT score could  be explained by an exacerbation, poor medication adherence, poor inhaler technique, or progression of COPD or comorbid conditions.  CAT MCID is 2 points  mMRC: mMRC (Modified Medical Research Council) Dyspnea Scale is used to assess the degree of baseline functional disability in patients of respiratory disease due to dyspnea. No minimal important difference is established. A decrease in score of 1 point or greater is considered a positive change.   Pulmonary Function Assessment:  Pulmonary Function Assessment - 05/13/23 1322       Breath   Bilateral Breath Sounds Clear    Shortness of Breath Yes;Limiting activity             Exercise Target Goals: Exercise Program Goal: Individual exercise prescription set using results from initial 6 min walk test and THRR while considering  patient's activity barriers and safety.   Exercise Prescription Goal: Initial  exercise prescription builds to 30-45 minutes a day of aerobic activity, 2-3 days per week.  Home exercise guidelines will be given to patient during program as part of exercise prescription that the participant will acknowledge.  Activity Barriers & Risk Stratification:  Activity Barriers & Cardiac Risk Stratification - 05/13/23 1321       Activity Barriers & Cardiac Risk Stratification   Activity Barriers Deconditioning;Muscular Weakness;Shortness of Breath;Arthritis;Balance Concerns    Cardiac Risk Stratification Moderate             6 Minute Walk:  6 Minute Walk     Row Name 05/13/23 1427         6 Minute Walk   Phase Initial     Distance 1320 feet     Walk Time 6 minutes     # of Rest Breaks 0     MPH 2.5     METS 2.63     RPE 11     Perceived Dyspnea  1.5     VO2 Peak 9.19     Symptoms No     Resting HR 93 bpm     Resting BP 118/58     Resting Oxygen Saturation  95 %     Exercise Oxygen Saturation  during 6 min walk 89 %     Max Ex. HR 116 bpm     Max Ex. BP 138/52     2 Minute Post BP 132/58       Interval HR   1 Minute HR 102     2 Minute HR 102     3 Minute HR 105     4 Minute HR 108     5 Minute HR 116     6 Minute HR 114     2 Minute Post HR 98     Interval Heart Rate? Yes       Interval Oxygen   Interval Oxygen? Yes     Baseline Oxygen Saturation % 95 %     1 Minute Oxygen Saturation % 95 %     1 Minute Liters of Oxygen 0 L     2 Minute Oxygen Saturation % 90 %     2 Minute Liters of Oxygen 0 L     3 Minute Oxygen Saturation % 89 %     3 Minute Liters of Oxygen 0 L     4 Minute Oxygen Saturation % 92 %     4 Minute Liters of Oxygen 0 L     5 Minute Oxygen Saturation % 91 %     5 Minute  Liters of Oxygen 0 L     6 Minute Oxygen Saturation % 91 %     6 Minute Liters of Oxygen 0 L     2 Minute Post Oxygen Saturation % 96 %     2 Minute Post Liters of Oxygen 0 L              Oxygen Initial Assessment:  Oxygen Initial Assessment -  05/13/23 1322       Home Oxygen   Home Oxygen Device None    Sleep Oxygen Prescription None    Home Exercise Oxygen Prescription None    Home Resting Oxygen Prescription None      Initial 6 min Walk   Oxygen Used None      Program Oxygen Prescription   Program Oxygen Prescription None      Intervention   Short Term Goals To learn and understand importance of monitoring SPO2 with pulse oximeter and demonstrate accurate use of the pulse oximeter.;To learn and understand importance of maintaining oxygen saturations>88%;To learn and demonstrate proper pursed lip breathing techniques or other breathing techniques. ;To learn and demonstrate proper use of respiratory medications    Long  Term Goals Exhibits compliance with exercise, home  and travel O2 prescription;Maintenance of O2 saturations>88%;Compliance with respiratory medication;Verbalizes importance of monitoring SPO2 with pulse oximeter and return demonstration;Exhibits proper breathing techniques, such as pursed lip breathing or other method taught during program session;Demonstrates proper use of MDI's             Oxygen Re-Evaluation:  Oxygen Re-Evaluation     Row Name 05/24/23 0847 06/21/23 0918           Program Oxygen Prescription   Program Oxygen Prescription None None        Home Oxygen   Home Oxygen Device None None      Sleep Oxygen Prescription None None      Home Exercise Oxygen Prescription None None      Home Resting Oxygen Prescription None None        Goals/Expected Outcomes   Short Term Goals To learn and understand importance of monitoring SPO2 with pulse oximeter and demonstrate accurate use of the pulse oximeter.;To learn and understand importance of maintaining oxygen saturations>88%;To learn and demonstrate proper pursed lip breathing techniques or other breathing techniques. ;To learn and demonstrate proper use of respiratory medications To learn and understand importance of monitoring SPO2 with  pulse oximeter and demonstrate accurate use of the pulse oximeter.;To learn and understand importance of maintaining oxygen saturations>88%;To learn and demonstrate proper pursed lip breathing techniques or other breathing techniques. ;To learn and demonstrate proper use of respiratory medications      Long  Term Goals Exhibits compliance with exercise, home  and travel O2 prescription;Maintenance of O2 saturations>88%;Compliance with respiratory medication;Verbalizes importance of monitoring SPO2 with pulse oximeter and return demonstration;Exhibits proper breathing techniques, such as pursed lip breathing or other method taught during program session;Demonstrates proper use of MDI's Exhibits compliance with exercise, home  and travel O2 prescription;Maintenance of O2 saturations>88%;Compliance with respiratory medication;Verbalizes importance of monitoring SPO2 with pulse oximeter and return demonstration;Exhibits proper breathing techniques, such as pursed lip breathing or other method taught during program session;Demonstrates proper use of MDI's      Goals/Expected Outcomes Compliance and understanding of oxygen saturation monitoring and breathing techniques to decrease shortness of breath Compliance and understanding of oxygen saturation monitoring and breathing techniques to decrease shortness of breath  Oxygen Discharge (Final Oxygen Re-Evaluation):  Oxygen Re-Evaluation - 06/21/23 0918       Program Oxygen Prescription   Program Oxygen Prescription None      Home Oxygen   Home Oxygen Device None    Sleep Oxygen Prescription None    Home Exercise Oxygen Prescription None    Home Resting Oxygen Prescription None      Goals/Expected Outcomes   Short Term Goals To learn and understand importance of monitoring SPO2 with pulse oximeter and demonstrate accurate use of the pulse oximeter.;To learn and understand importance of maintaining oxygen saturations>88%;To learn and  demonstrate proper pursed lip breathing techniques or other breathing techniques. ;To learn and demonstrate proper use of respiratory medications    Long  Term Goals Exhibits compliance with exercise, home  and travel O2 prescription;Maintenance of O2 saturations>88%;Compliance with respiratory medication;Verbalizes importance of monitoring SPO2 with pulse oximeter and return demonstration;Exhibits proper breathing techniques, such as pursed lip breathing or other method taught during program session;Demonstrates proper use of MDI's    Goals/Expected Outcomes Compliance and understanding of oxygen saturation monitoring and breathing techniques to decrease shortness of breath             Initial Exercise Prescription:  Initial Exercise Prescription - 05/13/23 1400       Date of Initial Exercise RX and Referring Provider   Date 05/13/23    Referring Provider Dewald    Expected Discharge Date 08/11/23      Treadmill   MPH 2    Grade 0    Minutes 15    METs 2.53      Bike   Level 1    Minutes 15    METs 2.5      Prescription Details   Frequency (times per week) 2    Duration Progress to 30 minutes of continuous aerobic without signs/symptoms of physical distress      Intensity   THRR 40-80% of Max Heartrate 56-112    Ratings of Perceived Exertion 11-13    Perceived Dyspnea 0-4      Progression   Progression Continue to progress workloads to maintain intensity without signs/symptoms of physical distress.      Resistance Training   Training Prescription Yes    Weight blue bands    Reps 10-15             Perform Capillary Blood Glucose checks as needed.  Exercise Prescription Changes:   Exercise Prescription Changes     Row Name 05/31/23 1500 06/14/23 1500 06/23/23 1500 06/28/23 1500       Response to Exercise   Blood Pressure (Admit) 118/60 102/58 -- 108/60    Blood Pressure (Exercise) 124/56 110/62 -- 120/60    Blood Pressure (Exit) 110/60 112/64 -- 108/66     Heart Rate (Admit) 85 bpm 89 bpm -- 93 bpm    Heart Rate (Exercise) 110 bpm 109 bpm -- 120 bpm    Heart Rate (Exit) 87 bpm 95 bpm -- 96 bpm    Oxygen Saturation (Admit) 95 % 95 % -- 95 %    Oxygen Saturation (Exercise) 94 % 91 % -- 89 %    Oxygen Saturation (Exit) 96 % 93 % -- 93 %    Rating of Perceived Exertion (Exercise) 11 11 -- 13    Perceived Dyspnea (Exercise) 1 2 -- 3    Duration Continue with 30 min of aerobic exercise without signs/symptoms of physical distress. Continue with 30 min of aerobic exercise without  signs/symptoms of physical distress. -- Continue with 30 min of aerobic exercise without signs/symptoms of physical distress.    Intensity THRR unchanged THRR unchanged -- THRR unchanged      Progression   Progression Continue to progress workloads to maintain intensity without signs/symptoms of physical distress. Continue to progress workloads to maintain intensity without signs/symptoms of physical distress. -- Continue to progress workloads to maintain intensity without signs/symptoms of physical distress.      Resistance Training   Training Prescription Yes Yes -- Yes    Weight blue bands blue bands -- blue bands    Reps 10-15 10-15 -- 10-15    Time 10 Minutes 10 Minutes -- 10 Minutes      Treadmill   MPH 2 2.2 -- 2.4    Grade 0 1 -- 2    Minutes 15 15 -- 15    METs 2.53 2.99 -- 3.5      Recumbant Elliptical   Level 2 3 -- 3    Minutes 15 15 -- 15    METs 2.4 2.9 -- 3.6      Home Exercise Plan   Plans to continue exercise at -- -- [employer] (comment) --    Frequency -- -- --  n/a --    Initial Home Exercises Provided -- -- 06/23/23 --             Exercise Comments:   Exercise Comments     Row Name 05/19/23 1502 06/23/23 1534         Exercise Comments Pt completed first day of group exercise. Walked on TM for 15 min, 2.0 mph, 0 incline, 2.3 METs. She then exercised on the recumbent elliptical for 15 min, level 2, METs 1.3. tolerated  well. Performed warm up and cool down unlimited. Discussed METs with good reception. Discussed with pt home exercise plan. Pt is currently dancing one hour x2 a week on nonrehab days. Encouraged pt to continue. We also talked about what she will do when she graduates from pulmonary rehab. Pt is checking in to [employer] gym for when she graduates to take the place of PR.               Exercise Goals and Review:   Exercise Goals     Row Name 05/13/23 1321 05/24/23 0846           Exercise Goals   Increase Physical Activity Yes Yes      Intervention Provide advice, education, support and counseling about physical activity/exercise needs.;Develop an individualized exercise prescription for aerobic and resistive training based on initial evaluation findings, risk stratification, comorbidities and participant's personal goals. Provide advice, education, support and counseling about physical activity/exercise needs.;Develop an individualized exercise prescription for aerobic and resistive training based on initial evaluation findings, risk stratification, comorbidities and participant's personal goals.      Expected Outcomes Short Term: Attend rehab on a regular basis to increase amount of physical activity.;Long Term: Add in home exercise to make exercise part of routine and to increase amount of physical activity.;Long Term: Exercising regularly at least 3-5 days a week. Short Term: Attend rehab on a regular basis to increase amount of physical activity.;Long Term: Add in home exercise to make exercise part of routine and to increase amount of physical activity.;Long Term: Exercising regularly at least 3-5 days a week.      Increase Strength and Stamina Yes Yes      Intervention Provide advice, education, support and counseling about  physical activity/exercise needs.;Develop an individualized exercise prescription for aerobic and resistive training based on initial evaluation findings, risk  stratification, comorbidities and participant's personal goals. Provide advice, education, support and counseling about physical activity/exercise needs.;Develop an individualized exercise prescription for aerobic and resistive training based on initial evaluation findings, risk stratification, comorbidities and participant's personal goals.      Expected Outcomes Short Term: Increase workloads from initial exercise prescription for resistance, speed, and METs.;Short Term: Perform resistance training exercises routinely during rehab and add in resistance training at home;Long Term: Improve cardiorespiratory fitness, muscular endurance and strength as measured by increased METs and functional capacity ( ) Short Term: Increase workloads from initial exercise prescription for resistance, speed, and METs.;Short Term: Perform resistance training exercises routinely during rehab and add in resistance training at home;Long Term: Improve cardiorespiratory fitness, muscular endurance and strength as measured by increased METs and functional capacity ( )      Able to understand and use rate of perceived exertion (RPE) scale Yes Yes      Intervention Provide education and explanation on how to use RPE scale Provide education and explanation on how to use RPE scale      Expected Outcomes Short Term: Able to use RPE daily in rehab to express subjective intensity level;Long Term:  Able to use RPE to guide intensity level when exercising independently Short Term: Able to use RPE daily in rehab to express subjective intensity level;Long Term:  Able to use RPE to guide intensity level when exercising independently      Able to understand and use Dyspnea scale Yes Yes      Intervention Provide education and explanation on how to use Dyspnea scale Provide education and explanation on how to use Dyspnea scale      Expected Outcomes Short Term: Able to use Dyspnea scale daily in rehab to express subjective sense of  shortness of breath during exertion;Long Term: Able to use Dyspnea scale to guide intensity level when exercising independently Short Term: Able to use Dyspnea scale daily in rehab to express subjective sense of shortness of breath during exertion;Long Term: Able to use Dyspnea scale to guide intensity level when exercising independently      Knowledge and understanding of Target Heart Rate Range (THRR) Yes Yes      Intervention Provide education and explanation of THRR including how the numbers were predicted and where they are located for reference Provide education and explanation of THRR including how the numbers were predicted and where they are located for reference      Expected Outcomes Short Term: Able to state/look up THRR;Long Term: Able to use THRR to govern intensity when exercising independently;Short Term: Able to use daily as guideline for intensity in rehab Short Term: Able to state/look up THRR;Long Term: Able to use THRR to govern intensity when exercising independently;Short Term: Able to use daily as guideline for intensity in rehab      Understanding of Exercise Prescription Yes Yes      Intervention Provide education, explanation, and written materials on patient's individual exercise prescription Provide education, explanation, and written materials on patient's individual exercise prescription      Expected Outcomes Short Term: Able to explain program exercise prescription;Long Term: Able to explain home exercise prescription to exercise independently Short Term: Able to explain program exercise prescription;Long Term: Able to explain home exercise prescription to exercise independently               Exercise Goals Re-Evaluation :  Exercise Goals  Re-Evaluation     Row Name 05/24/23 0846 06/21/23 0915           Exercise Goal Re-Evaluation   Exercise Goals Review Increase Physical Activity;Able to understand and use Dyspnea scale;Understanding of Exercise  Prescription;Increase Strength and Stamina;Knowledge and understanding of Target Heart Rate Range (THRR);Able to understand and use rate of perceived exertion (RPE) scale Increase Physical Activity;Able to understand and use Dyspnea scale;Understanding of Exercise Prescription;Increase Strength and Stamina;Knowledge and understanding of Target Heart Rate Range (THRR);Able to understand and use rate of perceived exertion (RPE) scale      Comments Sarah Harrell has completed 1 day of exercise. She walked on TM for 15 min, 2.0 mph, 0 incline, 2.3 METs. She then exercised on the recumbent elliptical for 15 min, level 2, METs 1.3. tolerated well. Performed warm up and cool down unlimited. Will begin to progress as tolerated. Sarah Harrell has completed 9 days of exercise. She is walking on the TM for 15 min, 2.2 mph, 1.0 incline, 2.3, 2.99 METs. She then is exercising on the recumbent elliptical for 15 min, level 2, METs 3.1. She is progressing well and her workloads will be increased today.  Performs warm up and cool down unlimited. Will  progress as tolerated.      Expected Outcomes Through exercise at rehab and at home, the patient will decrease shortness of breath with daily activities and feel confident in carrying out an exercise regime at home. Through exercise at rehab and at home, the patient will decrease shortness of breath with daily activities and feel confident in carrying out an exercise regime at home.               Discharge Exercise Prescription (Final Exercise Prescription Changes):  Exercise Prescription Changes - 06/28/23 1500       Response to Exercise   Blood Pressure (Admit) 108/60    Blood Pressure (Exercise) 120/60    Blood Pressure (Exit) 108/66    Heart Rate (Admit) 93 bpm    Heart Rate (Exercise) 120 bpm    Heart Rate (Exit) 96 bpm    Oxygen Saturation (Admit) 95 %    Oxygen Saturation (Exercise) 89 %    Oxygen Saturation (Exit) 93 %    Rating of Perceived Exertion (Exercise) 13     Perceived Dyspnea (Exercise) 3    Duration Continue with 30 min of aerobic exercise without signs/symptoms of physical distress.    Intensity THRR unchanged      Progression   Progression Continue to progress workloads to maintain intensity without signs/symptoms of physical distress.      Resistance Training   Training Prescription Yes    Weight blue bands    Reps 10-15    Time 10 Minutes      Treadmill   MPH 2.4    Grade 2    Minutes 15    METs 3.5      Recumbant Elliptical   Level 3    Minutes 15    METs 3.6             Nutrition:  Target Goals: Understanding of nutrition guidelines, daily intake of sodium 1500mg , cholesterol 200mg , calories 30% from fat and 7% or less from saturated fats, daily to have 5 or more servings of fruits and vegetables.  Biometrics:  Pre Biometrics - 05/13/23 1444       Pre Biometrics   Grip Strength 20 kg  Nutrition Therapy Plan and Nutrition Goals:  Nutrition Therapy & Goals - 06/21/23 1429       Nutrition Therapy   Diet General Healthy Diet    Drug/Food Interactions Statins/Certain Fruits      Personal Nutrition Goals   Nutrition Goal Patient to improve diet quality by using the plate method as a guide for meal planning to include lean protein/plant protein, fruits, vegetables, whole grains, nonfat dairy as part of a well-balanced diet.   Goal in progress.   Comments Sarah Harrell reports eating a wide variety of foods. She does crave carbs and she does not enjoy cooking. She is up 3.7# since starting with our program;she reports her typical weight is 112-116#. Per documenation, she is up ~13# since May 2024 at 113# at pulmonology visit on 02/01/23. No new labs at this time; triglycerides have been elevated. Patient will benefit from participation in pulmonary rehab for nutrition, exercise, and lifestyle modification.      Intervention Plan   Intervention Prescribe, educate and counsel regarding individualized specific  dietary modifications aiming towards targeted core components such as weight, hypertension, lipid management, diabetes, heart failure and other comorbidities.;Nutrition handout(s) given to patient.    Expected Outcomes Short Term Goal: Understand basic principles of dietary content, such as calories, fat, sodium, cholesterol and nutrients.;Long Term Goal: Adherence to prescribed nutrition plan.             Nutrition Assessments:  Nutrition Assessments - 05/19/23 1458       Rate Your Plate Scores   Pre Score 66            MEDIFICTS Score Key: >=70 Need to make dietary changes  40-70 Heart Healthy Diet <= 40 Therapeutic Level Cholesterol Diet  Flowsheet Row PULMONARY REHAB CHRONIC OBSTRUCTIVE PULMONARY DISEASE from 05/19/2023 in [facility]  Picture Your Plate Total Score on Admission 66      Picture Your Plate Scores: <16 Unhealthy dietary pattern with much room for improvement. 41-50 Dietary pattern unlikely to meet recommendations for good health and room for improvement. 51-60 More healthful dietary pattern, with some room for improvement.  >60 Healthy dietary pattern, although there may be some specific behaviors that could be improved.    Nutrition Goals Re-Evaluation:  Nutrition Goals Re-Evaluation     Row Name 05/19/23 1406 06/21/23 1429           Goals   Current Weight 122 lb 12.7 oz (55.7 kg) 126 lb 12.2 oz (57.5 kg)      Comment A1c WNL, lipids WNL A1c WNL,, triglycerides 209      Expected Outcome Sarah Harrell reports eating a wide variety of foods. She does crave carbs and she does not enjoy cooking. She reports some concern over recent weight gain of ~5-7#; she reports her typical weight is 112-116#. Will continue to monitor her weight as her current BMI is appropriate for age (34.9). Patient will benefit from participation in pulmonary rehab for nutrition, exercise, and lifestyle modification. Sarah Harrell reports  eating a wide variety of foods. She does crave carbs and she does not enjoy cooking. She is up 3.7# since starting with our program;she reports her typical weight is 112-116#. Per documenation, she is up ~13# since May 2024 at 113# at pulmonology visit on 02/01/23. No new labs at this time; triglycerides have been elevated. Patient will benefit from participation in pulmonary rehab for nutrition, exercise, and lifestyle modification.  Nutrition Goals Discharge (Final Nutrition Goals Re-Evaluation):  Nutrition Goals Re-Evaluation - 06/21/23 1429       Goals   Current Weight 126 lb 12.2 oz (57.5 kg)    Comment A1c WNL,, triglycerides 209    Expected Outcome Sarah Harrell reports eating a wide variety of foods. She does crave carbs and she does not enjoy cooking. She is up 3.7# since starting with our program;she reports her typical weight is 112-116#. Per documenation, she is up ~13# since May 2024 at 113# at pulmonology visit on 02/01/23. No new labs at this time; triglycerides have been elevated. Patient will benefit from participation in pulmonary rehab for nutrition, exercise, and lifestyle modification.             Psychosocial: Target Goals: Acknowledge presence or absence of significant depression and/or stress, maximize coping skills, provide positive support system. Participant is able to verbalize types and ability to use techniques and skills needed for reducing stress and depression.  Initial Review & Psychosocial Screening:  Initial Psych Review & Screening - 05/13/23 1316       Initial Review   Current issues with History of Depression;Current Psychotropic Meds      Family Dynamics   Good Support System? Yes    Comments Pt denies psychosocial barriers at this time      Barriers   Psychosocial barriers to participate in program There are no identifiable barriers or psychosocial needs.      Screening Interventions   Interventions Encouraged to exercise              Quality of Life Scores:  Scores of 19 and below usually indicate a poorer quality of life in these areas.  A difference of  2-3 points is a clinically meaningful difference.  A difference of 2-3 points in the total score of the Quality of Life Index has been associated with significant improvement in overall quality of life, self-image, physical symptoms, and general health in studies assessing change in quality of life.  PHQ-9: Review Flowsheet       05/13/2023  Depression screen PHQ 2/9  Decreased Interest 0  Down, Depressed, Hopeless 0  PHQ - 2 Score 0  Altered sleeping 1  Tired, decreased energy 1  Change in appetite 0  Feeling bad or failure about yourself  0  Trouble concentrating 0  Moving slowly or fidgety/restless 0  Suicidal thoughts 0  PHQ-9 Score 2  Difficult doing work/chores Not difficult at all    Details           Interpretation of Total Score  Total Score Depression Severity:  1-4 = Minimal depression, 5-9 = Mild depression, 10-14 = Moderate depression, 15-19 = Moderately severe depression, 20-27 = Severe depression   Psychosocial Evaluation and Intervention:  Psychosocial Evaluation - 05/13/23 1318       Psychosocial Evaluation & Interventions   Interventions Encouraged to exercise with the program and follow exercise prescription    Comments Sarah Harrell denies any psychosocial barriers or concerns at this time.    Expected Outcomes For Sarah Harrell to participate in PR free of any psychosocial barriers or concerns    Continue Psychosocial Services  No Follow up required             Psychosocial Re-Evaluation:  Psychosocial Re-Evaluation     Row Name 05/25/23 0953 06/22/23 1143           Psychosocial Re-Evaluation   Current issues with Current Depression;Current Psychotropic Meds Current Depression;Current Psychotropic Meds  Comments Sarah Harrell denies any psychosocial barriers or concerns at this time. She denies any needs. Sarah Harrell denies any psychosocial  barriers or concerns at the time of re-evaluation.      Expected Outcomes For Rowen to continue to attend PR without any psychosocial barriers or concerns For Zelah to continue to attend PR without any psychosocial barriers or concerns      Interventions Encouraged to attend Pulmonary Rehabilitation for the exercise Encouraged to attend Pulmonary Rehabilitation for the exercise      Continue Psychosocial Services  No Follow up required No Follow up required               Psychosocial Discharge (Final Psychosocial Re-Evaluation):  Psychosocial Re-Evaluation - 06/22/23 1143       Psychosocial Re-Evaluation   Current issues with Current Depression;Current Psychotropic Meds    Comments Sarah Harrell denies any psychosocial barriers or concerns at the time of re-evaluation.    Expected Outcomes For Nika to continue to attend PR without any psychosocial barriers or concerns    Interventions Encouraged to attend Pulmonary Rehabilitation for the exercise    Continue Psychosocial Services  No Follow up required             Education: Education Goals: Education classes will be provided on a weekly basis, covering required topics. Participant will state understanding/return demonstration of topics presented.  Learning Barriers/Preferences:  Learning Barriers/Preferences - 05/13/23 1318       Learning Barriers/Preferences   Learning Barriers None    Learning Preferences Group Instruction;Skilled Demonstration;Individual Instruction             Education Topics: Know Your Numbers Group instruction that is supported by a PowerPoint presentation. Instructor discusses importance of knowing and understanding resting, exercise, and post-exercise oxygen saturation, heart rate, and blood pressure. Oxygen saturation, heart rate, blood pressure, rating of perceived exertion, and dyspnea are reviewed along with a normal range for these values.    Exercise for the Pulmonary Patient Group instruction  that is supported by a PowerPoint presentation. Instructor discusses benefits of exercise, core components of exercise, frequency, duration, and intensity of an exercise routine, importance of utilizing pulse oximetry during exercise, safety while exercising, and options of places to exercise outside of rehab.  Flowsheet Row PULMONARY REHAB CHRONIC OBSTRUCTIVE PULMONARY DISEASE from 06/23/2023 in [facility] for Heart, Vascular, & Lung Health  Date 06/23/23  Educator EP  Instruction Review Code 1- Verbalizes Understanding       MET Level  Group instruction provided by PowerPoint, verbal discussion, and written material to support subject matter. Instructor reviews what METs are and how to increase METs.  Flowsheet Row PULMONARY REHAB CHRONIC OBSTRUCTIVE PULMONARY DISEASE from 05/26/2023 in [facility] for Heart, Vascular, & Lung Health  Date 05/26/23  Educator EP  Instruction Review Code 1- Verbalizes Understanding       Pulmonary Medications Verbally interactive group education provided by instructor with focus on inhaled medications and proper administration. Flowsheet Row PULMONARY REHAB CHRONIC OBSTRUCTIVE PULMONARY DISEASE from 06/16/2023 in [facility] for Heart, Vascular, & Lung Health  Date 06/16/23  Educator RT  Instruction Review Code 1- Verbalizes Understanding       Anatomy and Physiology of the Respiratory System Group instruction provided by PowerPoint, verbal discussion, and written material to support subject matter. Instructor reviews respiratory cycle and anatomical components of the respiratory system and their functions. Instructor also reviews differences in obstructive and restrictive respiratory diseases with  examples of each.  Flowsheet Row PULMONARY REHAB CHRONIC OBSTRUCTIVE PULMONARY DISEASE from 06/09/2023 in [facility] for Heart, Vascular, & Lung Health  Date  06/09/23  Educator Baird Lyons, RT  Instruction Review Code 1- Verbalizes Understanding       Oxygen Safety Group instruction provided by PowerPoint, verbal discussion, and written material to support subject matter. There is an overview of "What is Oxygen" and "Why do we need it".  Instructor also reviews how to create a safe environment for oxygen use, the importance of using oxygen as prescribed, and the risks of noncompliance. There is a brief discussion on traveling with oxygen and resources the patient may utilize.   Oxygen Use Group instruction provided by PowerPoint, verbal discussion, and written material to discuss how supplemental oxygen is prescribed and different types of oxygen supply systems. Resources for more information are provided.    Breathing Techniques Group instruction that is supported by demonstration and informational handouts. Instructor discusses the benefits of pursed lip and diaphragmatic breathing and detailed demonstration on how to perform both.     Risk Factor Reduction Group instruction that is supported by a PowerPoint presentation. Instructor discusses the definition of a risk factor, different risk factors for pulmonary disease, and how the heart and lungs work together. Flowsheet Row PULMONARY REHAB CHRONIC OBSTRUCTIVE PULMONARY DISEASE from 05/19/2023 in [facility] for Heart, Vascular, & Lung Health  Date 05/19/23  Educator E P  Instruction Review Code 1- Verbalizes Understanding       Pulmonary Diseases Group instruction provided by PowerPoint, verbal discussion, and written material to support subject matter. Instructor gives an overview of the different type of pulmonary diseases. There is also a discussion on risk factors and symptoms as well as ways to manage the diseases.   Stress and Energy Conservation Group instruction provided by PowerPoint, verbal discussion, and written material to support subject matter.  Instructor gives an overview of stress and the impact it can have on the body. Instructor also reviews ways to reduce stress. There is also a discussion on energy conservation and ways to conserve energy throughout the day.   Warning Signs and Symptoms Group instruction provided by PowerPoint, verbal discussion, and written material to support subject matter. Instructor reviews warning signs and symptoms of stroke, heart attack, cold and flu. Instructor also reviews ways to prevent the spread of infection.   Other Education Group or individual verbal, written, or video instructions that support the educational goals of the pulmonary rehab program. Flowsheet Row PULMONARY REHAB CHRONIC OBSTRUCTIVE PULMONARY DISEASE from 06/02/2023 in [facility] for Heart, Vascular, & Lung Health  Date 06/02/23  Educator RT  Instruction Review Code 1- Verbalizes Understanding        Knowledge Questionnaire Score:  Knowledge Questionnaire Score - 05/13/23 1439       Knowledge Questionnaire Score   Pre Score 17/18             Core Components/Risk Factors/Patient Goals at Admission:  Personal Goals and Risk Factors at Admission - 05/13/23 1319       Core Components/Risk Factors/Patient Goals on Admission    Weight Management Yes;Weight Loss    Intervention Weight Management: Develop a combined nutrition and exercise program designed to reach desired caloric intake, while maintaining appropriate intake of nutrient and fiber, sodium and fats, and appropriate energy expenditure required for the weight goal.;Weight Management: Provide education and appropriate resources to help participant work on and attain dietary  goals.;Weight Management/Obesity: Establish reasonable short term and long term weight goals.;Obesity: Provide education and appropriate resources to help participant work on and attain dietary goals.    Expected Outcomes Short Term: Continue to assess and modify  interventions until short term weight is achieved;Long Term: Adherence to nutrition and physical activity/exercise program aimed toward attainment of established weight goal;Weight Maintenance: Understanding of the daily nutrition guidelines, which includes 25-35% calories from fat, 7% or less cal from saturated fats, less than 200mg  cholesterol, less than 1.5gm of sodium, & 5 or more servings of fruits and vegetables daily;Weight Loss: Understanding of general recommendations for a balanced deficit meal plan, which promotes 1-2 lb weight loss per week and includes a negative energy balance of [id number] kcal/d;Understanding recommendations for meals to include 15-35% energy as protein, 25-35% energy from fat, 35-60% energy from carbohydrates, less than 200mg  of dietary cholesterol, 20-35 gm of total fiber daily;Understanding of distribution of calorie intake throughout the day with the consumption of 4-5 meals/snacks;Weight Gain: Understanding of general recommendations for a high calorie, high protein meal plan that promotes weight gain by distributing calorie intake throughout the day with the consumption for 4-5 meals, snacks, and/or supplements    Tobacco Cessation Yes    Number of packs per day Sarah Harrell states she still considers herself a smoker even though she has not had a cigarette in 17 weeks   Sarah Harrell has not smoked in 17 weeks   Intervention Assist the participant in steps to quit. Provide individualized education and counseling about committing to Tobacco Cessation, relapse prevention, and pharmacological support that can be provided by physician.;Education officer, environmental, assist with locating and accessing local/national Quit Smoking programs, and support quit date choice.    Expected Outcomes Short Term: Will demonstrate readiness to quit, by selecting a quit date.;Short Term: Will quit all tobacco product use, adhering to prevention of relapse plan.;Long Term: Complete abstinence from all tobacco  products for at least 12 months from quit date.    Improve shortness of breath with ADL's Yes    Intervention Provide education, individualized exercise plan and daily activity instruction to help decrease symptoms of SOB with activities of daily living.    Expected Outcomes Short Term: Improve cardiorespiratory fitness to achieve a reduction of symptoms when performing ADLs;Long Term: Be able to perform more ADLs without symptoms or delay the onset of symptoms             Core Components/Risk Factors/Patient Goals Review:   Goals and Risk Factor Review     Row Name 05/25/23 0956 06/22/23 1144           Core Components/Risk Factors/Patient Goals Review   Personal Goals Review Weight Management/Obesity;Tobacco Cessation;Improve shortness of breath with ADL's;Develop more efficient breathing techniques such as purse lipped breathing and diaphragmatic breathing and practicing self-pacing with activity. Weight Management/Obesity;Tobacco Cessation;Improve shortness of breath with ADL's;Develop more efficient breathing techniques such as purse lipped breathing and diaphragmatic breathing and practicing self-pacing with activity.      Review This is Sarah Harrell's first week in the program. Unable to assess her goal of weight loss yet. Sarah Harrell is working with our dietician to lower calorie consumption and eat a variety of healthy foods. Sarah Harrell's goals are progressing on decreasing her shortness of breath with ADLs and developing more efficient breathing techniques such as purse lipped breathing and diaphragmatic breathing; and practicing self-pacing with activity. We are teaching her these techniques and she is practicing them in class. Sarah Harrell's goal to quit smoking is  progressing. She states she quit smoking 17 weeks ago, but still considers herself a smoker. We are continuing to support her with [employer] materials. Goal not met weight loss yet. Currently, Sarah Harrell is up in weight since her orientation. Sarah Harrell is  working with our dietician to lower calorie consumption and eat a variety of healthy foods. Sarah Harrell's goals are progressing on decreasing her shortness of breath with ADLs and developing more efficient breathing techniques such as purse lipped breathing and diaphragmatic breathing; and practicing self-pacing with activity. We are teaching her these techniques and she is practicing them in class. Sarah Harrell's goal to quit smoking is met. Even though she considers herself a smoker, she quit 23 weeks ago and hasn't relapsed.      Expected Outcomes For Sarah Harrell to lose weight, quit smoking, improve her shortness of breath with ADLs, and develop more efficient breathing techniques. For Sarah Harrell to lose weight, improve her shortness of breath with ADLs, develop more efficient breathing techniques, and learn how to self pace.               Core Components/Risk Factors/Patient Goals at Discharge (Final Review):   Goals and Risk Factor Review - 06/22/23 1144       Core Components/Risk Factors/Patient Goals Review   Personal Goals Review Weight Management/Obesity;Tobacco Cessation;Improve shortness of breath with ADL's;Develop more efficient breathing techniques such as purse lipped breathing and diaphragmatic breathing and practicing self-pacing with activity.    Review Goal not met weight loss yet. Currently, Sarah Harrell is up in weight since her orientation. Sarah Harrell is working with our dietician to lower calorie consumption and eat a variety of healthy foods. Sarah Harrell's goals are progressing on decreasing her shortness of breath with ADLs and developing more efficient breathing techniques such as purse lipped breathing and diaphragmatic breathing; and practicing self-pacing with activity. We are teaching her these techniques and she is practicing them in class. Sarah Harrell's goal to quit smoking is met. Even though she considers herself a smoker, she quit 23 weeks ago and hasn't relapsed.    Expected Outcomes For [place] to lose weight, improve  her shortness of breath with ADLs, develop more efficient breathing techniques, and learn how to self pace.             ITP Comments:Pt is making expected progress toward Pulmonary Rehab goals after completing 12 sessions. Recommend continued exercise, life style modification, education, and utilization of breathing techniques to increase stamina and strength, while also decreasing shortness of breath with exertion.  Dr. Mechele Collin is Medical Director for Pulmonary Rehab at [facility].     Comments: Dr. Mechele Collin is Medical Director for Pulmonary Rehab at [facility].

## 2023-06-30 ENCOUNTER — Encounter (HOSPITAL_COMMUNITY)
Admission: RE | Admit: 2023-06-30 | Discharge: 2023-06-30 | Disposition: A | Discharge status: Home / Self Care | Payer: Medicare HMO | Source: Ambulatory Visit | Attending: Pulmonary Disease | Admitting: Pulmonary Disease

## 2023-06-30 DIAGNOSIS — J449 Chronic obstructive pulmonary disease, unspecified: Secondary | ICD-10-CM

## 2023-07-05 ENCOUNTER — Encounter (HOSPITAL_COMMUNITY)
Admission: RE | Admit: 2023-07-05 | Discharge: 2023-07-05 | Disposition: A | Discharge status: Home / Self Care | Payer: Medicare HMO | Source: Ambulatory Visit | Attending: Pulmonary Disease

## 2023-07-05 DIAGNOSIS — J449 Chronic obstructive pulmonary disease, unspecified: Secondary | ICD-10-CM | POA: Diagnosis not present

## 2023-07-05 NOTE — Progress Notes (Signed)
Daily Session Note  Patient Details  Name: Sarah Harrell MRN: [id number] Date of Birth: 1941/09/05 Referring Provider:   Doristine Devoid Pulmonary Rehab Walk Test from 05/13/2023 in [facility] for Heart, Vascular, & Lung Health  Referring Provider Dewald       Encounter Date: 07/05/2023  Check In:  Session Check In - 07/05/23 1434       Check-In   Supervising physician immediately available to respond to emergencies [facility] MD immediately available    Physician(s) Joni Reining, NP    Location [facility]    Staff Present Elissa Lovett BS, ACSM-CEP, Exercise Physiologist;Braxxton Stoudt Earlene Plater, MS, ACSM-CEP, Exercise Physiologist;Casey Thedore Mins, RN, BSN    Virtual Visit No    Medication changes reported     No    Fall or balance concerns reported    No    Tobacco Cessation No Change    Warm-up and Cool-down Performed as group-led instruction    Resistance Training Performed Yes    VAD Patient? No    PAD/SET Patient? No      Pain Assessment   Currently in Pain? No/denies    Multiple Pain Sites No             Capillary Blood Glucose: No results found for this or any previous visit (from the past 24 hour(s)).    Social History   Tobacco Use  Smoking Status Every Day   Types: Cigarettes  Smokeless Tobacco Never  Tobacco Comments   Pt has not smoked for 17 week. She does consider herself a smoker    Goals Met:  Proper associated with RPD/PD & O2 Sat Independence with exercise equipment Exercise tolerated well No report of concerns or symptoms today Strength training completed today  Goals Unmet:  Not Applicable  Comments: Service time is from 1311 to 1442.    Dr. Mechele Collin is Medical Director for Pulmonary Rehab at [facility]

## 2023-07-05 NOTE — Progress Notes (Signed)
Discussed smoking abstinence with pt. She sts she has been quit for 5 months. Congratulated pt. She sts she still has cravings and she asks her friend to blow smoke in her face at times. Discussed with pt keeping on guard of relapse. Gave her resources and encouraged her to look at the [employer] site for resources and support. She was appreciative.  Ethelda Chick BS, ACSM-CEP 07/05/2023 7:50 AM

## 2023-07-07 ENCOUNTER — Encounter (HOSPITAL_COMMUNITY)
Admission: RE | Admit: 2023-07-07 | Discharge: 2023-07-07 | Disposition: A | Discharge status: Home / Self Care | Payer: Medicare HMO | Source: Ambulatory Visit | Attending: Pulmonary Disease | Admitting: Pulmonary Disease

## 2023-07-07 DIAGNOSIS — J449 Chronic obstructive pulmonary disease, unspecified: Secondary | ICD-10-CM

## 2023-07-07 NOTE — Progress Notes (Signed)
Daily Session Note  Patient Details  Name: Sarah Harrell MRN: [id number] Date of Birth: 02/10/1941 Referring Provider:   Doristine Devoid Pulmonary Rehab Walk Test from 05/13/2023 in [facility] for Heart, Vascular, & Lung Health  Referring Provider Dewald       Encounter Date: 07/07/2023  Check In:  Session Check In - 07/07/23 1331       Check-In   Supervising physician immediately available to respond to emergencies [facility] MD immediately available    Physician(s) Jari Favre, NP    Location [facility]    Staff Present Elissa Lovett BS, ACSM-CEP, Exercise Physiologist;Teia Freitas Earlene Plater, MS, ACSM-CEP, Exercise Physiologist;Casey Synthia Innocent, RN, BSN;Samantha Belarus, RD, LDN    Virtual Visit No    Medication changes reported     No    Fall or balance concerns reported    No    Tobacco Cessation No Change    Warm-up and Cool-down Performed as group-led instruction    Resistance Training Performed Yes    VAD Patient? No    PAD/SET Patient? No      Pain Assessment   Currently in Pain? No/denies    Multiple Pain Sites No             Capillary Blood Glucose: No results found for this or any previous visit (from the past 24 hour(s)).    Social History   Tobacco Use  Smoking Status Every Day   Types: Cigarettes  Smokeless Tobacco Never  Tobacco Comments   Pt has not smoked for 17 week. She does consider herself a smoker    Goals Met:  Proper associated with RPD/PD & O2 Sat Independence with exercise equipment Exercise tolerated well No report of concerns or symptoms today Strength training completed today  Goals Unmet:  Not Applicable  Comments: Service time is from 1317 to 1450.   Dr. Mechele Collin is Medical Director for Pulmonary Rehab at [facility].

## 2023-07-11 DIAGNOSIS — F17200 Nicotine dependence, unspecified, uncomplicated: Secondary | ICD-10-CM | POA: Diagnosis not present

## 2023-07-11 DIAGNOSIS — E782 Mixed hyperlipidemia: Secondary | ICD-10-CM | POA: Diagnosis not present

## 2023-07-11 DIAGNOSIS — Z23 Encounter for immunization: Secondary | ICD-10-CM | POA: Diagnosis not present

## 2023-07-11 DIAGNOSIS — J449 Chronic obstructive pulmonary disease, unspecified: Secondary | ICD-10-CM | POA: Diagnosis not present

## 2023-07-11 DIAGNOSIS — J439 Emphysema, unspecified: Secondary | ICD-10-CM | POA: Diagnosis not present

## 2023-07-11 DIAGNOSIS — I10 Essential (primary) hypertension: Secondary | ICD-10-CM | POA: Diagnosis not present

## 2023-07-11 DIAGNOSIS — F3341 Major depressive disorder, recurrent, in partial remission: Secondary | ICD-10-CM | POA: Diagnosis not present

## 2023-07-12 ENCOUNTER — Telehealth: Payer: Self-pay | Admitting: Pulmonary Disease

## 2023-07-12 ENCOUNTER — Encounter (HOSPITAL_COMMUNITY)
Admission: RE | Admit: 2023-07-12 | Discharge: 2023-07-12 | Disposition: A | Discharge status: Home / Self Care | Payer: Medicare HMO | Source: Ambulatory Visit | Attending: Pulmonary Disease

## 2023-07-12 VITALS — Wt 129.2 lb

## 2023-07-12 DIAGNOSIS — J449 Chronic obstructive pulmonary disease, unspecified: Secondary | ICD-10-CM

## 2023-07-12 NOTE — Progress Notes (Addendum)
Daily Session Note  Patient Details  Name: Sarah Harrell MRN: [id number] Date of Birth: 1941/09/05 Referring Provider:   Doristine Devoid Pulmonary Rehab Walk Test from 05/13/2023 in [facility], [facility]  Referring Provider Dewald       Encounter Date: 07/12/2023  Check In:  Session Check In - 07/12/23 1422       Check-In   Supervising physician immediately available to respond to emergencies [facility] MD immediately available    Physician(s) Jari Favre, NP    Location [facility]    Staff Present Elissa Lovett BS, ACSM-CEP, Exercise Physiologist;Casey Synthia Innocent, RN, BSN;Samantha Belarus, RD, LDN    Virtual Visit No    Medication changes reported     No    Fall or balance concerns reported    No    Tobacco Cessation No Change    Warm-up and Cool-down Performed as group-led instruction    Resistance Training Performed Yes    VAD Patient? No    PAD/SET Patient? No      Pain Assessment   Currently in Pain? No/denies    Multiple Pain Sites No             Capillary Blood Glucose: No results found for this or any previous visit (from the past 24 hour(s)).   Exercise Prescription Changes - 07/12/23 1500       Response to Exercise   Blood Pressure (Admit) 100/50    Blood Pressure (Exercise) 140/66    Blood Pressure (Exit) 122/60    Heart Rate (Admit) 90 bpm    Heart Rate (Exercise) 127 bpm    Heart Rate (Exit) 97 bpm    Oxygen Saturation (Admit) 94 %    Oxygen Saturation (Exercise) 87 %    Oxygen Saturation (Exit) 94 %    Rating of Perceived Exertion (Exercise) 15    Perceived Dyspnea (Exercise) 3    Duration Progress to 30 minutes of  aerobic without signs/symptoms of physical distress    Intensity THRR unchanged      Progression   Progression Continue to progress workloads to maintain intensity without signs/symptoms of physical distress.      Resistance Training   Training Prescription Yes     Weight blue bands    Reps 10-15    Time 10 Minutes      Treadmill   MPH 2.4    Grade 1    Minutes 13    METs 3.17      Recumbant Elliptical   Level 3    Minutes 5             Social History   Tobacco Use  Smoking Status Every Day   Types: Cigarettes  Smokeless Tobacco Never  Tobacco Comments   Pt has not smoked for 17 week. She does consider herself a smoker    Goals Met:  Independence with exercise equipment Exercise tolerated well No report of concerns or symptoms today Strength training completed today  Goals Unmet:  Not Applicable  Comments: Pt unable to completed 30 min exercise due to wheezing, hypoxia, congestion. Encouraged to call MD. Service time is from 1317 to 1450.  Dr. Mechele Collin is Medical Director for Pulmonary Rehab at [facility].

## 2023-07-12 NOTE — Telephone Encounter (Signed)
I called and spoke with the pt  She states that she was having a hard time with her breathing today at rehab  She has minimal SOB   Denies any increased cough or wheezing  She does have some sinus congestion She has had sats today ranging from 95-91% ra  Does not have o2 at home  She is taking her anoro and albuterol  I advised pt that if her sats are consistently below 90% she needs to seek emergent care  She wanted other advice  Routing to provider of the day Please advise, thanks!

## 2023-07-12 NOTE — Telephone Encounter (Signed)
Patient states having symptom of shortness of breath. Also states oxygen level in the 85 %. Pharmacy is [facility]. Patient phone number is [phone].

## 2023-07-13 NOTE — Telephone Encounter (Signed)
Spoke with the pt and notified of response from Tammy  She verbalized understanding  She states understanding  Nothing further needed

## 2023-07-13 NOTE — Telephone Encounter (Signed)
If she is still having problems she will need an office visit if we are unable to see her then she will need to go to her primary care provider or go to urgent care.  Please contact office for sooner follow up if symptoms do not improve or worsen or seek emergency care

## 2023-07-14 ENCOUNTER — Encounter (HOSPITAL_COMMUNITY)
Admission: RE | Admit: 2023-07-14 | Discharge: 2023-07-14 | Disposition: A | Discharge status: Home / Self Care | Payer: Medicare HMO | Source: Ambulatory Visit | Attending: Pulmonary Disease | Admitting: Pulmonary Disease

## 2023-07-14 DIAGNOSIS — J449 Chronic obstructive pulmonary disease, unspecified: Secondary | ICD-10-CM

## 2023-07-14 NOTE — Progress Notes (Signed)
Daily Session Note  Patient Details  Name: Sarah Harrell MRN: [id number] Date of Birth: [birth year] Referring Provider:   Doristine Devoid Pulmonary Rehab Walk Test from 05/13/2023 in [facility] for [facility], Vascular, & Lung [facility]  Referring Provider Dewald       Encounter Date: 07/14/2023  Check In:  Session Check In - 07/14/23 1407       Check-In   Supervising physician immediately available to respond to emergencies [facility] MD immediately available    Physician(s) Jari Favre, NP    [facility]    Staff Present Elissa Lovett BS, ACSM-CEP, Exercise Physiologist;Casey Synthia Innocent, RN, BSN;Samantha Belarus, RD, Rexene Agent, MS, ACSM-CEP, Exercise Physiologist    Virtual Visit No    Medication changes reported     No    Fall or balance concerns reported    No    Tobacco Cessation No Change    Warm-up and Cool-down Performed as group-led instruction    Resistance Training Performed Yes    VAD Patient? No    PAD/SET Patient? No      Pain Assessment   Currently in Pain? No/denies    Multiple Pain Sites No             Capillary Blood Glucose: No results found for this or any previous visit (from the past 24 hour(s)).    Social History   Tobacco Use  Smoking Status Every Day   Types: Cigarettes  Smokeless Tobacco Never  Tobacco Comments   Pt has not smoked for 17 week. She does consider herself a smoker    Goals Met:  Independence with exercise equipment Exercise tolerated well No report of concerns or symptoms today Strength training completed today  Goals Unmet:  Not Applicable  Comments: Pt required 2L O2 while walking on treadmill. Service time is from 1317 to 1450.  Dr. Mechele Collin is Medical Director for Pulmonary Rehab at [facility]

## 2023-07-14 NOTE — Progress Notes (Signed)
Sarah Harrell required 2L O2 while walking on the treadmill today in pulmonary rehab. BBS were diminished with expiratory wheeze which pt states is new. Albuterol inhaler was done. She also states that she feels like something is in her chest. Pt is scheduled to see her pulmonologist on 10/22. Encouraged pt to keep that appointment.

## 2023-07-19 ENCOUNTER — Ambulatory Visit: Payer: Medicare HMO | Admitting: Primary Care

## 2023-07-19 ENCOUNTER — Encounter: Payer: Self-pay | Admitting: Primary Care

## 2023-07-19 ENCOUNTER — Encounter (HOSPITAL_COMMUNITY)
Admission: RE | Admit: 2023-07-19 | Discharge: 2023-07-19 | Disposition: A | Discharge status: Home / Self Care | Payer: Medicare HMO | Source: Ambulatory Visit | Attending: Pulmonary Disease | Admitting: Pulmonary Disease

## 2023-07-19 VITALS — BP 140/74 | HR 97 | Ht 60.0 in | Wt 127.4 lb

## 2023-07-19 DIAGNOSIS — J449 Chronic obstructive pulmonary disease, unspecified: Secondary | ICD-10-CM

## 2023-07-19 DIAGNOSIS — J432 Centrilobular emphysema: Secondary | ICD-10-CM | POA: Diagnosis not present

## 2023-07-19 NOTE — Progress Notes (Signed)
Daily Session Note  Patient Details  Name: Sarah Harrell MRN: [id number] Date of Birth: 08/23/1941 Referring Provider:   Doristine Devoid Pulmonary Rehab Walk Test from 05/13/2023 in [facility] for [facility], Vascular, & Lung Health  Referring Provider Dewald       Encounter Date: 07/19/2023  Check In:  Session Check In - 07/19/23 1328       Check-In   Supervising physician immediately available to respond to emergencies [facility] MD immediately available    Physician(s) Edd Fabian, NP    [facility]    Staff Present Elissa Lovett BS, ACSM-CEP, Exercise Physiologist;Casey Synthia Innocent, RN, BSN;Samantha Belarus, RD, Rexene Agent, MS, ACSM-CEP, Exercise Physiologist    Virtual Visit No    Medication changes reported     No    Fall or balance concerns reported    No    Tobacco Cessation No Change    Warm-up and Cool-down Performed as group-led instruction    Resistance Training Performed Yes    VAD Patient? No    PAD/SET Patient? No      Pain Assessment   Currently in Pain? No/denies    Multiple Pain Sites No             Capillary Blood Glucose: No results found for this or any previous visit (from the past 24 hour(s)).    Social History   Tobacco Use  Smoking Status Every Day   Types: Cigarettes  Smokeless Tobacco Never  Tobacco Comments   Pt has not smoked for 17 week. She does consider herself a smoker    Goals Met:  Independence with exercise equipment Exercise tolerated well No report of concerns or symptoms today Strength training completed today  Goals Unmet:  Not Applicable  Comments: Service time is from 1316 to 1442  Dr. Mechele Collin is Medical Director for Pulmonary Rehab at [facility].

## 2023-07-19 NOTE — Progress Notes (Signed)
@Patient  ID: Sarah Harrell, female    DOB: [birth year], 82 y.o.   MRN: [id number]  Chief Complaint  Patient presents with   Follow-up    Referring provider: Lorenda Ishihara  HPI: 82 year old female, Former smoker PMH significant for emphysema, lung nodule Patient of Dr. Francine Graven    07/19/2023 Patient presents today for acute office visit due to shortness of breath. Patient of Dr. Francine Graven. Noted to be hypoxic during pulmonary rehab. She is asymptomatic. No acute respiratory complaints. O2 ranging from 94-95 RA prior to exertion. SpO2 89-91% with activity on [place] pulmonary rehab exercise data log. O2 is lower when she wakes up in the morning high 80s. She walked in the office today and did not need supplemental oxygen. She is hesitant to be started on oxygen. She had very slight wheeze on exam, she wanted to hold off on oral or inhaled steriods. She is agreeing to checking overnight oximetry test to ensure she does not need nocturnal oxygen and get an updated CXR, PFTs up to date.    Allergies  Allergen Reactions   Caffeine Hives    Immunization History  Administered Date(s) Administered   Influenza, High Dose Seasonal PF 06/05/2018    Past Medical History:  Diagnosis Date   Depressive disorder, not elsewhere classified    Elevated fasting glucose    Glucose 110 as of 09/13/13   Fatigue    Guaiac positive stools    Normal colonoscopy 2009   Insomnia, unspecified    Joint pain    Mixed hyperlipidemia    Systemic sclerosis (HCC)    Tobacco dependence    Vitamin D deficiency     Tobacco History: Social History   Tobacco Use  Smoking Status Former   Types: Cigarettes  Smokeless Tobacco Never  Tobacco Comments   Pt quit smoking 12-27-2022.   Counseling given: Not Answered Tobacco comments: Pt quit smoking 12-27-2022.   Outpatient Medications Prior to Visit  Medication Sig Dispense Refill   albuterol (VENTOLIN HFA) 108 (90 Base) MCG/ACT inhaler Inhale  1-2 puffs into the lungs every 6 (six) hours as needed.     amLODipine (NORVASC) 5 MG tablet Take 5 mg by mouth daily.     citalopram (CELEXA) 20 MG tablet Take 20 mg by mouth daily.     ECHINACEA PO  (Patient not taking: Reported on 04/21/2023)     Multiple Vitamin (MULTIVITAMIN) tablet Take 1 tablet by mouth daily.     rosuvastatin (CRESTOR) 10 MG tablet Take 10 mg by mouth daily.     traZODone (DESYREL) 100 MG tablet Take 50 mg by mouth at bedtime.     umeclidinium-vilanterol (ANORO ELLIPTA) 62.5-25 MCG/ACT AEPB Inhale 1 puff into the lungs daily. 60 each 5   No facility-administered medications prior to visit.      Review of Systems  Review of Systems  Constitutional: Negative.   HENT: Negative.    Respiratory:  Positive for wheezing. Negative for cough and shortness of breath.   Cardiovascular: Negative.    Physical Exam  BP (!) 140/74 (BP Location: Left Arm, Cuff Size: Normal)   Pulse 97   Ht 5' (1.524 m)   Wt 127 lb 6.4 oz (57.8 kg)   SpO2 94%   BMI 24.88 kg/m  Physical Exam Constitutional:      Appearance: Normal appearance.  Cardiovascular:     Rate and Rhythm: Normal rate and regular rhythm.  Pulmonary:     Effort: Pulmonary effort is normal.  No respiratory distress.     Breath sounds: No stridor. No rhonchi or rales.     Comments: Isolated wheeze right lower lung field  Neurological:     Mental Status: She is alert.      Lab Results:  CBC No results found for: "WBC", "RBC", "HGB", "HCT", "PLT", "MCV", "MCH", "MCHC", "RDW", "LYMPHSABS", "MONOABS", "EOSABS", "BASOSABS"  BMET No results found for: "NA", "K", "CL", "CO2", "GLUCOSE", "BUN", "CREATININE", "CALCIUM", "GFRNONAA", "GFRAA"  BNP No results found for: "BNP"  ProBNP No results found for: "PROBNP"  Imaging: No results found.   Assessment & Plan:   1. Centrilobular emphysema (HCC) - Pulse oximetry, overnight; Future - DG Chest 2 View; Future  Emphysema - Patient was noted to be hypoxic  during pulmonary rehab. She is asymptomatic. No acute respiratory complaints. - Last PFTs in July 2024 showed severe airflow limitation with moderate diffusion defect  - She did not desaturate on ambulatory walk test today with forehead probe; we will check overnight oximetry test to ensure she does not need oxygen at night - Getting updated chest x-ray imaging - Continue Anoro Elipta 1 puff daily and Albuterol 2 puffs every 4-6 hours as needed for breakthrough shortness of breath or wheezing  Orders: Overnight oximetry on room air  CXR  Pulmonary nodule - Resolved on CT chest in April 2022, scarring noted both lung apices and RUL anteriorly   Follow-up: 3-4 months with Dr. Ellin Goodie, NP 07/19/2023

## 2023-07-19 NOTE — Patient Instructions (Addendum)
You did not desaturate on ambulatory walk test today   You do not need supplemental oxygen with exertion We will check overnight oximetry test to ensure that you do not need oxygen at night You do not really need repeat breathing test, last done in July 2024 We should get updated chest x-ray imaging Continue Anoro1 puff daily Use Albuterol 2 puffs every 4-6 hours as needed for breakthrough shortness of breath or wheezing  Orders: Overnight oximetry on room air  CXR  Follow-up: 3-4 months with Dr. Francine Graven

## 2023-07-20 ENCOUNTER — Ambulatory Visit: Payer: Medicare HMO

## 2023-07-21 ENCOUNTER — Encounter (HOSPITAL_COMMUNITY)
Admission: RE | Admit: 2023-07-21 | Discharge: 2023-07-21 | Disposition: A | Discharge status: Home / Self Care | Payer: Medicare HMO | Source: Ambulatory Visit | Attending: Pulmonary Disease | Admitting: Pulmonary Disease

## 2023-07-21 DIAGNOSIS — J449 Chronic obstructive pulmonary disease, unspecified: Secondary | ICD-10-CM

## 2023-07-21 NOTE — Progress Notes (Signed)
Daily Session Note  Patient Details  Name: Sarah Harrell MRN: [id number] Date of Birth: Apr 07, 1941 Referring Provider:   Doristine Devoid Pulmonary Rehab Walk Test from 05/13/2023 in [facility] for Heart, Vascular, & Lung Health  Referring Provider Dewald       Encounter Date: 07/21/2023  Check In:  Session Check In - 07/21/23 1513       Check-In   Supervising physician immediately available to respond to emergencies [facility] MD immediately available    Physician(s) Casimer Lanius, NP    Location [facility]    Staff Present Elissa Lovett BS, ACSM-CEP, Exercise Physiologist;Casey Synthia Innocent, RN, BSN;Samantha Belarus, RD, Rexene Agent, MS, ACSM-CEP, Exercise Physiologist    Virtual Visit No    Medication changes reported     No    Fall or balance concerns reported    No    Tobacco Cessation No Change    Warm-up and Cool-down Performed as group-led instruction    Resistance Training Performed Yes    VAD Patient? No    PAD/SET Patient? No      Pain Assessment   Currently in Pain? No/denies    Multiple Pain Sites No             Capillary Blood Glucose: No results found for this or any previous visit (from the past 24 hour(s)).    Social History   Tobacco Use  Smoking Status Former   Types: Cigarettes  Smokeless Tobacco Never  Tobacco Comments   Pt quit smoking 12-27-2022.    Goals Met:  Independence with exercise equipment Exercise tolerated well No report of concerns or symptoms today Strength training completed today  Goals Unmet:  Not Applicable  Comments: Service time is from 1320 to 1451  Dr. Mechele Collin is Medical Director for Pulmonary Rehab at [facility].

## 2023-07-26 ENCOUNTER — Encounter (HOSPITAL_COMMUNITY)
Admission: RE | Admit: 2023-07-26 | Discharge: 2023-07-26 | Disposition: A | Discharge status: Home / Self Care | Payer: Medicare HMO | Source: Ambulatory Visit | Attending: Pulmonary Disease

## 2023-07-26 ENCOUNTER — Ambulatory Visit: Payer: Medicare HMO

## 2023-07-26 VITALS — Wt 129.6 lb

## 2023-07-26 DIAGNOSIS — J449 Chronic obstructive pulmonary disease, unspecified: Secondary | ICD-10-CM

## 2023-07-26 DIAGNOSIS — R918 Other nonspecific abnormal finding of lung field: Secondary | ICD-10-CM | POA: Diagnosis not present

## 2023-07-26 NOTE — Progress Notes (Signed)
Daily Session Note  Patient Details  Name: Sarah Harrell MRN: [id number] Date of Birth: 1940/12/12 Referring Provider:   Doristine Devoid Pulmonary Rehab Walk Test from 05/13/2023 in [facility] for Heart, Vascular, & Lung Health  Referring Provider Dewald       Encounter Date: 07/26/2023  Check In:  Session Check In - 07/26/23 1424       Check-In   Supervising physician immediately available to respond to emergencies [facility] MD immediately available    Physician(s) Robin Searing, NP    Location [facility]    Staff Present Durel Salts, Patriciaann Clan, RN, BSN;Samantha Belarus, RD, Rexene Agent, MS, ACSM-CEP, Exercise Physiologist;Johnny Hale Bogus, MS, Exercise Physiologist    Virtual Visit No    Medication changes reported     No    Fall or balance concerns reported    No    Tobacco Cessation No Change    Warm-up and Cool-down Performed as group-led instruction    Resistance Training Performed Yes    VAD Patient? No    PAD/SET Patient? No      Pain Assessment   Currently in Pain? No/denies    Multiple Pain Sites No             Capillary Blood Glucose: No results found for this or any previous visit (from the past 24 hour(s)).   Exercise Prescription Changes - 07/26/23 1500       Response to Exercise   Blood Pressure (Admit) 106/56    Blood Pressure (Exercise) 148/78    Blood Pressure (Exit) 118/60    Heart Rate (Admit) 95 bpm    Heart Rate (Exercise) 109 bpm    Heart Rate (Exit) 96 bpm    Oxygen Saturation (Admit) 95 %    Oxygen Saturation (Exercise) 89 %    Oxygen Saturation (Exit) 93 %    Rating of Perceived Exertion (Exercise) 13    Perceived Dyspnea (Exercise) 3    Duration Progress to 30 minutes of  aerobic without signs/symptoms of physical distress    Intensity THRR unchanged      Progression   Progression Continue to progress workloads to maintain intensity without signs/symptoms of physical distress.       Resistance Training   Training Prescription Yes    Weight blue bands    Reps 10-15    Time 10 Minutes      Treadmill   MPH 2.4    Grade 2    Minutes 15    METs 3.5      Recumbant Elliptical   Level 4    Minutes 15    METs 4.1             Social History   Tobacco Use  Smoking Status Former   Types: Cigarettes  Smokeless Tobacco Never  Tobacco Comments   Pt quit smoking 12-27-2022.    Goals Met:  Independence with exercise equipment Exercise tolerated well No report of concerns or symptoms today Strength training completed today  Goals Unmet:  Not Applicable  Comments: Service time is from 1306 to 1445    Dr. Mechele Collin is Medical Director for Pulmonary Rehab at [facility].

## 2023-07-27 NOTE — Progress Notes (Signed)
Pulmonary Individual Treatment Plan  Patient Details  Name: Sarah Harrell MRN: [id number] Date of Birth: [birth year] Referring Provider:   Doristine Devoid Pulmonary Rehab Walk Test from 05/13/2023 in [facility]  Referring Provider Dewald       Initial Encounter Date:  Flowsheet Row Pulmonary Rehab Walk Test from 05/13/2023 in [facility]  Date 05/13/23       Visit Diagnosis: Stage 3 severe COPD by GOLD classification (HCC)  Patient's Home Medications on Admission:   Current Outpatient Medications:    albuterol (VENTOLIN HFA) 108 (90 Base) MCG/ACT inhaler, Inhale 1-2 puffs into the lungs every 6 (six) hours as needed., Disp: , Rfl:    amLODipine (NORVASC) 5 MG tablet, Take 5 mg by mouth daily., Disp: , Rfl:    citalopram (CELEXA) 20 MG tablet, Take 20 mg by mouth daily., Disp: , Rfl:    ECHINACEA PO, , Disp: , Rfl:    Multiple Vitamin (MULTIVITAMIN) tablet, Take 1 tablet by mouth daily., Disp: , Rfl:    rosuvastatin (CRESTOR) 10 MG tablet, Take 10 mg by mouth daily., Disp: , Rfl:    traZODone (DESYREL) 100 MG tablet, Take 50 mg by mouth at bedtime., Disp: , Rfl:    umeclidinium-vilanterol (ANORO ELLIPTA) 62.5-25 MCG/ACT AEPB, Inhale 1 puff into the lungs daily., Disp: 60 each, Rfl: 5  Past Medical History: Past Medical History:  Diagnosis Date   Depressive disorder, not elsewhere classified    Elevated fasting glucose    Glucose 110 as of 09/13/13   Fatigue    Guaiac positive stools    Normal colonoscopy 2009   Insomnia, unspecified    Joint pain    Mixed hyperlipidemia    Systemic sclerosis (HCC)    Tobacco dependence    Vitamin D deficiency     Tobacco Use: Social History   Tobacco Use  Smoking Status Former   Types: Cigarettes  Smokeless Tobacco Never  Tobacco Comments   Pt quit smoking 12-27-2022.    Labs: Review Flowsheet        No data  to display          Capillary Blood Glucose: No results found for: "GLUCAP"   Pulmonary Assessment Scores:  Pulmonary Assessment Scores     Row Name 05/13/23 1439         ADL UCSD   ADL Phase Entry     SOB Score total 23       CAT Score   CAT Score 12       mMRC Score   mMRC Score 3             UCSD: Self-administered rating of dyspnea associated with activities of daily living (ADLs) 6-point scale (0 = "not at all" to 5 = "maximal or unable to do because of breathlessness")  Scoring Scores range from 0 to 120.  Minimally important difference is 5 units  CAT: CAT can identify the health impairment of COPD patients and is better correlated with disease progression.  CAT has a scoring range of zero to 40. The CAT score is classified into four groups of low (less than 10), medium (10 - 20), high (21-30) and very high (31-40) based on the impact level of disease on health status. A CAT score over 10 suggests significant symptoms.  A worsening CAT score could be explained by an exacerbation, poor medication adherence, poor inhaler  technique, or progression of COPD or comorbid conditions.  CAT MCID is 2 points  mMRC: mMRC (Modified Medical Research Council) Dyspnea Scale is used to assess the degree of baseline functional disability in patients of respiratory disease due to dyspnea. No minimal important difference is established. A decrease in score of 1 point or greater is considered a positive change.   Pulmonary Function Assessment:  Pulmonary Function Assessment - 05/13/23 1322       Breath   Bilateral Breath Sounds Clear    Shortness of Breath Yes;Limiting activity             Exercise Target Goals: Exercise Program Goal: Individual exercise prescription set using results from initial 6 min walk test and THRR while considering  patient's activity barriers and safety.   Exercise Prescription Goal: Initial exercise prescription builds to 30-45 minutes a  day of aerobic activity, 2-3 days per week.  Home exercise guidelines will be given to patient during program as part of exercise prescription that the participant will acknowledge.  Activity Barriers & Risk Stratification:  Activity Barriers & Cardiac Risk Stratification - 05/13/23 1321       Activity Barriers & Cardiac Risk Stratification   Activity Barriers Deconditioning;Muscular Weakness;Shortness of Breath;Arthritis;Balance Concerns    Cardiac Risk Stratification Moderate             6 Minute Walk:  6 Minute Walk     Row Name 05/13/23 1427         6 Minute Walk   Phase Initial     Distance 1320 feet     Walk Time 6 minutes     # of Rest Breaks 0     MPH 2.5     METS 2.63     RPE 11     Perceived Dyspnea  1.5     VO2 Peak 9.19     Symptoms No     Resting HR 93 bpm     Resting BP 118/58     Resting Oxygen Saturation  95 %     Exercise Oxygen Saturation  during 6 min walk 89 %     Max Ex. HR 116 bpm     Max Ex. BP 138/52     2 Minute Post BP 132/58       Interval HR   1 Minute HR 102     2 Minute HR 102     3 Minute HR 105     4 Minute HR 108     5 Minute HR 116     6 Minute HR 114     2 Minute Post HR 98     Interval Heart Rate? Yes       Interval Oxygen   Interval Oxygen? Yes     Baseline Oxygen Saturation % 95 %     1 Minute Oxygen Saturation % 95 %     1 Minute Liters of Oxygen 0 L     2 Minute Oxygen Saturation % 90 %     2 Minute Liters of Oxygen 0 L     3 Minute Oxygen Saturation % 89 %     3 Minute Liters of Oxygen 0 L     4 Minute Oxygen Saturation % 92 %     4 Minute Liters of Oxygen 0 L     5 Minute Oxygen Saturation % 91 %     5 Minute Liters of Oxygen 0 L     6  Minute Oxygen Saturation % 91 %     6 Minute Liters of Oxygen 0 L     2 Minute Post Oxygen Saturation % 96 %     2 Minute Post Liters of Oxygen 0 L              Oxygen Initial Assessment:  Oxygen Initial Assessment - 05/13/23 1322       Home Oxygen   Home Oxygen  Device None    Sleep Oxygen Prescription None    Home Exercise Oxygen Prescription None    Home Resting Oxygen Prescription None      Initial 6 min Walk   Oxygen Used None      Program Oxygen Prescription   Program Oxygen Prescription None      Intervention   Short Term Goals To learn and understand importance of monitoring SPO2 with pulse oximeter and demonstrate accurate use of the pulse oximeter.;To learn and understand importance of maintaining oxygen saturations>88%;To learn and demonstrate proper pursed lip breathing techniques or other breathing techniques. ;To learn and demonstrate proper use of respiratory medications    Long  Term Goals Exhibits compliance with exercise, home  and travel O2 prescription;Maintenance of O2 saturations>88%;Compliance with respiratory medication;Verbalizes importance of monitoring SPO2 with pulse oximeter and return demonstration;Exhibits proper breathing techniques, such as pursed lip breathing or other method taught during program session;Demonstrates proper use of MDI's             Oxygen Re-Evaluation:  Oxygen Re-Evaluation     Row Name 05/24/23 0847 06/21/23 0918 07/18/23 1504         Program Oxygen Prescription   Program Oxygen Prescription None None None       Home Oxygen   Home Oxygen Device None None None     Sleep Oxygen Prescription None None None     Home Exercise Oxygen Prescription None None None     Home Resting Oxygen Prescription None None None       Goals/Expected Outcomes   Short Term Goals To learn and understand importance of monitoring SPO2 with pulse oximeter and demonstrate accurate use of the pulse oximeter.;To learn and understand importance of maintaining oxygen saturations>88%;To learn and demonstrate proper pursed lip breathing techniques or other breathing techniques. ;To learn and demonstrate proper use of respiratory medications To learn and understand importance of monitoring SPO2 with pulse oximeter and  demonstrate accurate use of the pulse oximeter.;To learn and understand importance of maintaining oxygen saturations>88%;To learn and demonstrate proper pursed lip breathing techniques or other breathing techniques. ;To learn and demonstrate proper use of respiratory medications To learn and understand importance of monitoring SPO2 with pulse oximeter and demonstrate accurate use of the pulse oximeter.;To learn and understand importance of maintaining oxygen saturations>88%;To learn and demonstrate proper pursed lip breathing techniques or other breathing techniques. ;To learn and demonstrate proper use of respiratory medications     Long  Term Goals Exhibits compliance with exercise, home  and travel O2 prescription;Maintenance of O2 saturations>88%;Compliance with respiratory medication;Verbalizes importance of monitoring SPO2 with pulse oximeter and return demonstration;Exhibits proper breathing techniques, such as pursed lip breathing or other method taught during program session;Demonstrates proper use of MDI's Exhibits compliance with exercise, home  and travel O2 prescription;Maintenance of O2 saturations>88%;Compliance with respiratory medication;Verbalizes importance of monitoring SPO2 with pulse oximeter and return demonstration;Exhibits proper breathing techniques, such as pursed lip breathing or other method taught during program session;Demonstrates proper use of MDI's Exhibits compliance with exercise, home  and  travel O2 prescription;Maintenance of O2 saturations>88%;Compliance with respiratory medication;Verbalizes importance of monitoring SPO2 with pulse oximeter and return demonstration;Exhibits proper breathing techniques, such as pursed lip breathing or other method taught during program session;Demonstrates proper use of MDI's     Comments -- -- Pt required 2L O2 on treadmill the last session. Will monitor. It is possible she has a respiratory infection.     Goals/Expected Outcomes Compliance  and understanding of oxygen saturation monitoring and breathing techniques to decrease shortness of breath Compliance and understanding of oxygen saturation monitoring and breathing techniques to decrease shortness of breath Compliance and understanding of oxygen saturation monitoring and breathing techniques to decrease shortness of breath              Oxygen Discharge (Final Oxygen Re-Evaluation):  Oxygen Re-Evaluation - 07/18/23 1504       Program Oxygen Prescription   Program Oxygen Prescription None      Home Oxygen   Home Oxygen Device None    Sleep Oxygen Prescription None    Home Exercise Oxygen Prescription None    Home Resting Oxygen Prescription None      Goals/Expected Outcomes   Short Term Goals To learn and understand importance of monitoring SPO2 with pulse oximeter and demonstrate accurate use of the pulse oximeter.;To learn and understand importance of maintaining oxygen saturations>88%;To learn and demonstrate proper pursed lip breathing techniques or other breathing techniques. ;To learn and demonstrate proper use of respiratory medications    Long  Term Goals Exhibits compliance with exercise, home  and travel O2 prescription;Maintenance of O2 saturations>88%;Compliance with respiratory medication;Verbalizes importance of monitoring SPO2 with pulse oximeter and return demonstration;Exhibits proper breathing techniques, such as pursed lip breathing or other method taught during program session;Demonstrates proper use of MDI's    Comments Pt required 2L O2 on treadmill the last session. Will monitor. It is possible she has a respiratory infection.    Goals/Expected Outcomes Compliance and understanding of oxygen saturation monitoring and breathing techniques to decrease shortness of breath             Initial Exercise Prescription:  Initial Exercise Prescription - 05/13/23 1400       Date of Initial Exercise RX and Referring Provider   Date 05/13/23     Referring Provider Dewald    Expected Discharge Date 08/11/23      Treadmill   MPH 2    Grade 0    Minutes 15    METs 2.53      Bike   Level 1    Minutes 15    METs 2.5      Prescription Details   Frequency (times per week) 2    Duration Progress to 30 minutes of continuous aerobic without signs/symptoms of physical distress      Intensity   THRR 40-80% of Max Heartrate 56-112    Ratings of Perceived Exertion 11-13    Perceived Dyspnea 0-4      Progression   Progression Continue to progress workloads to maintain intensity without signs/symptoms of physical distress.      Resistance Training   Training Prescription Yes    Weight blue bands    Reps 10-15             Perform Capillary Blood Glucose checks as needed.  Exercise Prescription Changes:   Exercise Prescription Changes     Row Name 05/31/23 1500 06/14/23 1500 06/23/23 1500 06/28/23 1500 07/12/23 1500     Response to Exercise  Blood Pressure (Admit) 118/60 102/58 -- 108/60 100/50   Blood Pressure (Exercise) 124/56 110/62 -- 120/60 140/66   Blood Pressure (Exit) 110/60 112/64 -- 108/66 122/60   Heart Rate (Admit) 85 bpm 89 bpm -- 93 bpm 90 bpm   Heart Rate (Exercise) 110 bpm 109 bpm -- 120 bpm 127 bpm   Heart Rate (Exit) 87 bpm 95 bpm -- 96 bpm 97 bpm   Oxygen Saturation (Admit) 95 % 95 % -- 95 % 94 %   Oxygen Saturation (Exercise) 94 % 91 % -- 89 % 87 %   Oxygen Saturation (Exit) 96 % 93 % -- 93 % 94 %   Rating of Perceived Exertion (Exercise) 11 11 -- 13 15   Perceived Dyspnea (Exercise) 1 2 -- 3 3   Duration Continue with 30 min of aerobic exercise without signs/symptoms of physical distress. Continue with 30 min of aerobic exercise without signs/symptoms of physical distress. -- Continue with 30 min of aerobic exercise without signs/symptoms of physical distress. Progress to 30 minutes of  aerobic without signs/symptoms of physical distress   Intensity THRR unchanged THRR unchanged -- THRR unchanged  THRR unchanged     Progression   Progression Continue to progress workloads to maintain intensity without signs/symptoms of physical distress. Continue to progress workloads to maintain intensity without signs/symptoms of physical distress. -- Continue to progress workloads to maintain intensity without signs/symptoms of physical distress. Continue to progress workloads to maintain intensity without signs/symptoms of physical distress.     Resistance Training   Training Prescription Yes Yes -- Yes Yes   Weight blue bands blue bands -- blue bands blue bands   Reps 10-15 10-15 -- 10-15 10-15   Time 10 Minutes 10 Minutes -- 10 Minutes 10 Minutes     Treadmill   MPH 2 2.2 -- 2.4 2.4   Grade 0 1 -- 2 1   Minutes 15 15 -- 15 13   METs 2.53 2.99 -- 3.5 3.17     Recumbant Elliptical   Level 2 3 -- 3 3   Minutes 15 15 -- 15 5   METs 2.4 2.9 -- 3.6 --     Home Exercise Plan   Plans to continue exercise at -- -- [employer] (comment) -- --   Frequency -- -- --  n/a -- --   Initial Home Exercises Provided -- -- 06/23/23 -- --    Row Name 07/26/23 1500             Response to Exercise   Blood Pressure (Admit) 106/56       Blood Pressure (Exercise) 148/78       Blood Pressure (Exit) 118/60       Heart Rate (Admit) 95 bpm       Heart Rate (Exercise) 109 bpm       Heart Rate (Exit) 96 bpm       Oxygen Saturation (Admit) 95 %       Oxygen Saturation (Exercise) 89 %       Oxygen Saturation (Exit) 93 %       Rating of Perceived Exertion (Exercise) 13       Perceived Dyspnea (Exercise) 3       Duration Progress to 30 minutes of  aerobic without signs/symptoms of physical distress       Intensity THRR unchanged         Progression   Progression Continue to progress workloads to maintain intensity without signs/symptoms of physical distress.  Resistance Training   Training Prescription Yes       Weight blue bands       Reps 10-15       Time 10 Minutes          Treadmill   MPH 2.4       Grade 2       Minutes 15       METs 3.5         Recumbant Elliptical   Level 4       Minutes 15       METs 4.1                Exercise Comments:   Exercise Comments     Row Name 05/19/23 1502 06/23/23 1534         Exercise Comments Pt completed first day of group exercise. Walked on TM for 15 min, 2.0 mph, 0 incline, 2.3 METs. She then exercised on the recumbent elliptical for 15 min, level 2, METs 1.3. tolerated well. Performed warm up and cool down unlimited. Discussed METs with good reception. Discussed with pt home exercise plan. Pt is currently dancing one hour x2 a week on nonrehab days. Encouraged pt to continue. We also talked about what she will do when she graduates from pulmonary rehab. Pt is checking in to [employer] [facility] for when she graduates to take the place of PR.               Exercise Goals and Review:   Exercise Goals     Row Name 05/13/23 1321 05/24/23 0846           Exercise Goals   Increase Physical Activity Yes Yes      Intervention Provide advice, education, support and counseling about physical activity/exercise needs.;Develop an individualized exercise prescription for aerobic and resistive training based on initial evaluation findings, risk stratification, comorbidities and participant's personal goals. Provide advice, education, support and counseling about physical activity/exercise needs.;Develop an individualized exercise prescription for aerobic and resistive training based on initial evaluation findings, risk stratification, comorbidities and participant's personal goals.      Expected Outcomes Short Term: Attend rehab on a regular basis to increase amount of physical activity.;Long Term: Add in home exercise to make exercise part of routine and to increase amount of physical activity.;Long Term: Exercising regularly at least 3-5 days a week. Short Term: Attend rehab on a regular basis to increase amount of  physical activity.;Long Term: Add in home exercise to make exercise part of routine and to increase amount of physical activity.;Long Term: Exercising regularly at least 3-5 days a week.      Increase Strength and Stamina Yes Yes      Intervention Provide advice, education, support and counseling about physical activity/exercise needs.;Develop an individualized exercise prescription for aerobic and resistive training based on initial evaluation findings, risk stratification, comorbidities and participant's personal goals. Provide advice, education, support and counseling about physical activity/exercise needs.;Develop an individualized exercise prescription for aerobic and resistive training based on initial evaluation findings, risk stratification, comorbidities and participant's personal goals.      Expected Outcomes Short Term: Increase workloads from initial exercise prescription for resistance, speed, and METs.;Short Term: Perform resistance training exercises routinely during rehab and add in resistance training at home;Long Term: Improve cardiorespiratory fitness, muscular endurance and strength as measured by increased METs and functional capacity ( ) Short Term: Increase workloads from initial exercise prescription for resistance, speed, and METs.;Short Term: Perform resistance training  exercises routinely during rehab and add in resistance training at home;Long Term: Improve cardiorespiratory fitness, muscular endurance and strength as measured by increased METs and functional capacity ( )      Able to understand and use rate of perceived exertion (RPE) scale Yes Yes      Intervention Provide education and explanation on how to use RPE scale Provide education and explanation on how to use RPE scale      Expected Outcomes Short Term: Able to use RPE daily in rehab to express subjective intensity level;Long Term:  Able to use RPE to guide intensity level when exercising independently Short Term:  Able to use RPE daily in rehab to express subjective intensity level;Long Term:  Able to use RPE to guide intensity level when exercising independently      Able to understand and use Dyspnea scale Yes Yes      Intervention Provide education and explanation on how to use Dyspnea scale Provide education and explanation on how to use Dyspnea scale      Expected Outcomes Short Term: Able to use Dyspnea scale daily in rehab to express subjective sense of shortness of breath during exertion;Long Term: Able to use Dyspnea scale to guide intensity level when exercising independently Short Term: Able to use Dyspnea scale daily in rehab to express subjective sense of shortness of breath during exertion;Long Term: Able to use Dyspnea scale to guide intensity level when exercising independently      Knowledge and understanding of Target Heart Rate Range (THRR) Yes Yes      Intervention Provide education and explanation of THRR including how the numbers were predicted and where they are located for reference Provide education and explanation of THRR including how the numbers were predicted and where they are located for reference      Expected Outcomes Short Term: Able to state/look up THRR;Long Term: Able to use THRR to govern intensity when exercising independently;Short Term: Able to use daily as guideline for intensity in rehab Short Term: Able to state/look up THRR;Long Term: Able to use THRR to govern intensity when exercising independently;Short Term: Able to use daily as guideline for intensity in rehab      Understanding of Exercise Prescription Yes Yes      Intervention Provide education, explanation, and written materials on patient's individual exercise prescription Provide education, explanation, and written materials on patient's individual exercise prescription      Expected Outcomes Short Term: Able to explain program exercise prescription;Long Term: Able to explain home exercise prescription to  exercise independently Short Term: Able to explain program exercise prescription;Long Term: Able to explain home exercise prescription to exercise independently               Exercise Goals Re-Evaluation :  Exercise Goals Re-Evaluation     Row Name 05/24/23 0846 06/21/23 0915 07/18/23 1500         Exercise Goal Re-Evaluation   Exercise Goals Review Increase Physical Activity;Able to understand and use Dyspnea scale;Understanding of Exercise Prescription;Increase Strength and Stamina;Knowledge and understanding of Target Heart Rate Range (THRR);Able to understand and use rate of perceived exertion (RPE) scale Increase Physical Activity;Able to understand and use Dyspnea scale;Understanding of Exercise Prescription;Increase Strength and Stamina;Knowledge and understanding of Target Heart Rate Range (THRR);Able to understand and use rate of perceived exertion (RPE) scale Increase Physical Activity;Able to understand and use Dyspnea scale;Understanding of Exercise Prescription;Increase Strength and Stamina;Knowledge and understanding of Target Heart Rate Range (THRR);Able to understand and use rate of  perceived exertion (RPE) scale     Comments Devin has completed 1 day of exercise. She walked on TM for 15 min, 2.0 mph, 0 incline, 2.3 METs. She then exercised on the recumbent elliptical for 15 min, level 2, METs 1.3. tolerated well. Performed warm up and cool down unlimited. Will begin to progress as tolerated. Alexa has completed 9 days of exercise. She is walking on the TM for 15 min, 2.2 mph, 1.0 incline, 2.3, 2.99 METs. She then is exercising on the recumbent elliptical for 15 min, level 2, METs 3.1. She is progressing well and her workloads will be increased today.  Performs warm up and cool down unlimited. Will  progress as tolerated. Megin has completed 17 days of exercise. She has perfect attendance. She was walking on the TM for 15 min, 2.3 mph, 2.0 incline,  3.4 METs however she has become wheezy  and hypoxic and preferred to decrease speed and incline temporarily.  She then is exercising on the recumbent elliptical for 15 min, level 4, METs 3.8, which she has tolerated progression well.   Will  progress as tolerated, she is motivated but is resistant to supplemental O2.     Expected Outcomes Through exercise at rehab and at home, the patient will decrease shortness of breath with daily activities and feel confident in carrying out an exercise regime at home. Through exercise at rehab and at home, the patient will decrease shortness of breath with daily activities and feel confident in carrying out an exercise regime at home. Through exercise at rehab and at home, the patient will decrease shortness of breath with daily activities and feel confident in carrying out an exercise regime at home.              Discharge Exercise Prescription (Final Exercise Prescription Changes):  Exercise Prescription Changes - 07/26/23 1500       Response to Exercise   Blood Pressure (Admit) 106/56    Blood Pressure (Exercise) 148/78    Blood Pressure (Exit) 118/60    Heart Rate (Admit) 95 bpm    Heart Rate (Exercise) 109 bpm    Heart Rate (Exit) 96 bpm    Oxygen Saturation (Admit) 95 %    Oxygen Saturation (Exercise) 89 %    Oxygen Saturation (Exit) 93 %    Rating of Perceived Exertion (Exercise) 13    Perceived Dyspnea (Exercise) 3    Duration Progress to 30 minutes of  aerobic without signs/symptoms of physical distress    Intensity THRR unchanged      Progression   Progression Continue to progress workloads to maintain intensity without signs/symptoms of physical distress.      Resistance Training   Training Prescription Yes    Weight blue bands    Reps 10-15    Time 10 Minutes      Treadmill   MPH 2.4    Grade 2    Minutes 15    METs 3.5      Recumbant Elliptical   Level 4    Minutes 15    METs 4.1             Nutrition:  Target Goals: Understanding of nutrition  guidelines, daily intake of sodium 1500mg , cholesterol 200mg , calories 30% from fat and 7% or less from saturated fats, daily to have 5 or more servings of fruits and vegetables.  Biometrics:  Pre Biometrics - 05/13/23 1444       Pre Biometrics   Grip Strength 20  kg              Nutrition Therapy Plan and Nutrition Goals:  Nutrition Therapy & Goals - 07/19/23 1413       Nutrition Therapy   Diet General Healthy Diet    Drug/Food Interactions Statins/Certain Fruits      Personal Nutrition Goals   Nutrition Goal Patient to improve diet quality by using the plate method as a guide for meal planning to include lean protein/plant protein, fruits, vegetables, whole grains, nonfat dairy as part of a well-balanced diet.   Goal in progress.   Comments Goal in progress. Semaja reports eating a wide variety of foods. She does crave carbs and she does not enjoy cooking. She is up 5.7# since starting with our program;she reports her typical weight is 112-116#. Per documenation, she is up ~15# since May 2024 at 113# at pulmonology visit on 02/01/23. No new labs at this time; triglycerides have been elevated. She reports concern about weight gain; however, she remains contemplative toward changing snacking behaviors. She does report snacking on peanut butter in the middle of the night. Patient will benefit from participation in pulmonary rehab for nutrition, exercise, and lifestyle modification.      Intervention Plan   Intervention Prescribe, educate and counsel regarding individualized specific dietary modifications aiming towards targeted core components such as weight, hypertension, lipid management, diabetes, heart failure and other comorbidities.;Nutrition handout(s) given to patient.    Expected Outcomes Short Term Goal: Understand basic principles of dietary content, such as calories, fat, sodium, cholesterol and nutrients.;Long Term Goal: Adherence to prescribed nutrition plan.              Nutrition Assessments:  Nutrition Assessments - 05/19/23 1458       Rate Your Plate Scores   Pre Score 66            MEDIFICTS Score Key: >=70 Need to make dietary changes  40-70 Heart Healthy Diet <= 40 Therapeutic Level Cholesterol Diet  Flowsheet Row PULMONARY REHAB CHRONIC OBSTRUCTIVE PULMONARY DISEASE from 05/19/2023 in [facility] for Heart, Vascular, & Lung Health  Picture Your Plate Total Score on Admission 66      Picture Your Plate Scores: <91 Unhealthy dietary pattern with much room for improvement. 41-50 Dietary pattern unlikely to meet recommendations for good health and room for improvement. 51-60 More healthful dietary pattern, with some room for improvement.  >60 Healthy dietary pattern, although there may be some specific behaviors that could be improved.    Nutrition Goals Re-Evaluation:  Nutrition Goals Re-Evaluation     Row Name 05/19/23 1406 06/21/23 1429 07/19/23 1413         Goals   Current Weight 122 lb 12.7 oz (55.7 kg) 126 lb 12.2 oz (57.5 kg) 128 lb 12 oz (58.4 kg)     Comment A1c WNL, lipids WNL A1c WNL,, triglycerides 209 no new labs; most recent labs  A1c WNL, triglycerides 209     Expected Outcome Blaklee reports eating a wide variety of foods. She does crave carbs and she does not enjoy cooking. She reports some concern over recent weight gain of ~5-7#; she reports her typical weight is 112-116#. Will continue to monitor her weight as her current BMI is appropriate for age (72.9). Patient will benefit from participation in pulmonary rehab for nutrition, exercise, and lifestyle modification. Jahnaya reports eating a wide variety of foods. She does crave carbs and she does not enjoy cooking. She is up 3.7#  since starting with our program;she reports her typical weight is 112-116#. Per documenation, she is up ~13# since May 2024 at 113# at pulmonology visit on 02/01/23. No new labs at this time; triglycerides have been elevated.  Patient will benefit from participation in pulmonary rehab for nutrition, exercise, and lifestyle modification. Goal in progress. Shakara reports eating a wide variety of foods. She does crave carbs and she does not enjoy cooking. She is up 5.7# since starting with our program;she reports her typical weight is 112-116#. Per documenation, she is up ~15# since May 2024 at 113# at pulmonology visit on 02/01/23. No new labs at this time; triglycerides have been elevated. She reports concern about weight gain; however, she remains contemplative toward changing snacking behaviors. She does report snacking on peanut butter in the middle of the night. Patient will benefit from participation in pulmonary rehab for nutrition, exercise, and lifestyle modification.              Nutrition Goals Discharge (Final Nutrition Goals Re-Evaluation):  Nutrition Goals Re-Evaluation - 07/19/23 1413       Goals   Current Weight 128 lb 12 oz (58.4 kg)    Comment no new labs; most recent labs  A1c WNL, triglycerides 209    Expected Outcome Goal in progress. Senora reports eating a wide variety of foods. She does crave carbs and she does not enjoy cooking. She is up 5.7# since starting with our program;she reports her typical weight is 112-116#. Per documenation, she is up ~15# since May 2024 at 113# at pulmonology visit on 02/01/23. No new labs at this time; triglycerides have been elevated. She reports concern about weight gain; however, she remains contemplative toward changing snacking behaviors. She does report snacking on peanut butter in the middle of the night. Patient will benefit from participation in pulmonary rehab for nutrition, exercise, and lifestyle modification.             Psychosocial: Target Goals: Acknowledge presence or absence of significant depression and/or stress, maximize coping skills, provide positive support system. Participant is able to verbalize types and ability to use techniques and skills  needed for reducing stress and depression.  Initial Review & Psychosocial Screening:  Initial Psych Review & Screening - 05/13/23 1316       Initial Review   Current issues with History of Depression;Current Psychotropic Meds      Family Dynamics   Good Support System? Yes    Comments Pt denies psychosocial barriers at this time      Barriers   Psychosocial barriers to participate in program There are no identifiable barriers or psychosocial needs.      Screening Interventions   Interventions Encouraged to exercise             Quality of Life Scores:  Scores of 19 and below usually indicate a poorer quality of life in these areas.  A difference of  2-3 points is a clinically meaningful difference.  A difference of 2-3 points in the total score of the Quality of Life Index has been associated with significant improvement in overall quality of life, self-image, physical symptoms, and general health in studies assessing change in quality of life.  PHQ-9: Review Flowsheet       05/13/2023  Depression screen PHQ 2/9  Decreased Interest 0  Down, Depressed, Hopeless 0  PHQ - 2 Score 0  Altered sleeping 1  Tired, decreased energy 1  Change in appetite 0  Feeling bad or failure  about yourself  0  Trouble concentrating 0  Moving slowly or fidgety/restless 0  Suicidal thoughts 0  PHQ-9 Score 2  Difficult doing work/chores Not difficult at all    Details           Interpretation of Total Score  Total Score Depression Severity:  1-4 = Minimal depression, 5-9 = Mild depression, 10-14 = Moderate depression, 15-19 = Moderately severe depression, 20-27 = Severe depression   Psychosocial Evaluation and Intervention:  Psychosocial Evaluation - 05/13/23 1318       Psychosocial Evaluation & Interventions   Interventions Encouraged to exercise with the program and follow exercise prescription    Comments Emyrson denies any psychosocial barriers or concerns at this time.     Expected Outcomes For Beuna to participate in PR free of any psychosocial barriers or concerns    Continue Psychosocial Services  No Follow up required             Psychosocial Re-Evaluation:  Psychosocial Re-Evaluation     Row Name 05/25/23 0953 06/22/23 1143 07/22/23 1026         Psychosocial Re-Evaluation   Current issues with Current Depression;Current Psychotropic Meds Current Depression;Current Psychotropic Meds Current Depression;Current Psychotropic Meds     Comments Raelani denies any psychosocial barriers or concerns at this time. She denies any needs. Calynn denies any psychosocial barriers or concerns at the time of re-evaluation. Yatziry denies any psychosocial barriers or concerns at the time of re-evaluation.     Expected Outcomes For Mersaydes to continue to attend PR without any psychosocial barriers or concerns For Gianne to continue to attend PR without any psychosocial barriers or concerns For Romilly to continue to attend PR without any psychosocial barriers or concerns     Interventions Encouraged to attend Pulmonary Rehabilitation for the exercise Encouraged to attend Pulmonary Rehabilitation for the exercise Encouraged to attend Pulmonary Rehabilitation for the exercise     Continue Psychosocial Services  No Follow up required No Follow up required No Follow up required              Psychosocial Discharge (Final Psychosocial Re-Evaluation):  Psychosocial Re-Evaluation - 07/22/23 1026       Psychosocial Re-Evaluation   Current issues with Current Depression;Current Psychotropic Meds    Comments Laticha denies any psychosocial barriers or concerns at the time of re-evaluation.    Expected Outcomes For Jameelah to continue to attend PR without any psychosocial barriers or concerns    Interventions Encouraged to attend Pulmonary Rehabilitation for the exercise    Continue Psychosocial Services  No Follow up required             Education: Education Goals: Education classes  will be provided on a weekly basis, covering required topics. Participant will state understanding/return demonstration of topics presented.  Learning Barriers/Preferences:  Learning Barriers/Preferences - 05/13/23 1318       Learning Barriers/Preferences   Learning Barriers None    Learning Preferences Group Instruction;Skilled Demonstration;Individual Instruction             Education Topics: Know Your Numbers Group instruction that is supported by a PowerPoint presentation. Instructor discusses importance of knowing and understanding resting, exercise, and post-exercise oxygen saturation, heart rate, and blood pressure. Oxygen saturation, heart rate, blood pressure, rating of perceived exertion, and dyspnea are reviewed along with a normal range for these values.  Flowsheet Row PULMONARY REHAB CHRONIC OBSTRUCTIVE PULMONARY DISEASE from 06/30/2023 in [facility] for Heart, Vascular, & Lung  Health  Date 06/30/23  Educator EP  Instruction Review Code 1- Verbalizes Understanding       Exercise for the Pulmonary Patient Group instruction that is supported by a PowerPoint presentation. Instructor discusses benefits of exercise, core components of exercise, frequency, duration, and intensity of an exercise routine, importance of utilizing pulse oximetry during exercise, safety while exercising, and options of places to exercise outside of rehab.  Flowsheet Row PULMONARY REHAB CHRONIC OBSTRUCTIVE PULMONARY DISEASE from 06/23/2023 in [facility] for Heart, Vascular, & Lung Health  Date 06/23/23  Educator EP  Instruction Review Code 1- Verbalizes Understanding       MET Level  Group instruction provided by PowerPoint, verbal discussion, and written material to support subject matter. Instructor reviews what METs are and how to increase METs.  Flowsheet Row PULMONARY REHAB CHRONIC OBSTRUCTIVE PULMONARY DISEASE from 05/26/2023 in [facility] for Heart, Vascular, & Lung Health  Date 05/26/23  Educator EP  Instruction Review Code 1- Verbalizes Understanding       Pulmonary Medications Verbally interactive group education provided by instructor with focus on inhaled medications and proper administration. Flowsheet Row PULMONARY REHAB CHRONIC OBSTRUCTIVE PULMONARY DISEASE from 06/16/2023 in [facility] for Heart, Vascular, & Lung Health  Date 06/16/23  Educator RT  Instruction Review Code 1- Verbalizes Understanding       Anatomy and Physiology of the Respiratory System Group instruction provided by PowerPoint, verbal discussion, and written material to support subject matter. Instructor reviews respiratory cycle and anatomical components of the respiratory system and their functions. Instructor also reviews differences in obstructive and restrictive respiratory diseases with examples of each.  Flowsheet Row PULMONARY REHAB CHRONIC OBSTRUCTIVE PULMONARY DISEASE from 06/09/2023 in [facility] for Heart, Vascular, & Lung Health  Date 06/09/23  Educator Baird Lyons, RT  Instruction Review Code 1- Verbalizes Understanding       Oxygen Safety Group instruction provided by PowerPoint, verbal discussion, and written material to support subject matter. There is an overview of "What is Oxygen" and "Why do we need it".  Instructor also reviews how to create a safe environment for oxygen use, the importance of using oxygen as prescribed, and the risks of noncompliance. There is a brief discussion on traveling with oxygen and resources the patient may utilize. Flowsheet Row PULMONARY REHAB CHRONIC OBSTRUCTIVE PULMONARY DISEASE from 07/07/2023 in [facility] for Heart, Vascular, & Lung Health  Date 07/07/23  Educator RN  Instruction Review Code 1- Verbalizes Understanding       Oxygen Use Group instruction provided by PowerPoint, verbal  discussion, and written material to discuss how supplemental oxygen is prescribed and different types of oxygen supply systems. Resources for more information are provided.  Flowsheet Row PULMONARY REHAB CHRONIC OBSTRUCTIVE PULMONARY DISEASE from 07/14/2023 in [facility] for Heart, Vascular, & Lung Health  Date 07/14/23  Educator RT  Instruction Review Code 1- Verbalizes Understanding       Breathing Techniques Group instruction that is supported by demonstration and informational handouts. Instructor discusses the benefits of pursed lip and diaphragmatic breathing and detailed demonstration on how to perform both.  Flowsheet Row PULMONARY REHAB CHRONIC OBSTRUCTIVE PULMONARY DISEASE from 07/21/2023 in [facility] for Heart, Vascular, & Lung Health  Date 07/21/23  Educator RN  Instruction Review Code 1- Verbalizes Understanding        Risk Factor Reduction Group instruction that is supported by  a PowerPoint presentation. Instructor discusses the definition of a risk factor, different risk factors for pulmonary disease, and how the heart and lungs work together. Flowsheet Row PULMONARY REHAB CHRONIC OBSTRUCTIVE PULMONARY DISEASE from 05/19/2023 in [facility] for Heart, Vascular, & Lung Health  Date 05/19/23  Educator E P  Instruction Review Code 1- Verbalizes Understanding       Pulmonary Diseases Group instruction provided by PowerPoint, verbal discussion, and written material to support subject matter. Instructor gives an overview of the different type of pulmonary diseases. There is also a discussion on risk factors and symptoms as well as ways to manage the diseases.   Stress and Energy Conservation Group instruction provided by PowerPoint, verbal discussion, and written material to support subject matter. Instructor gives an overview of stress and the impact it can have on the body. Instructor also reviews  ways to reduce stress. There is also a discussion on energy conservation and ways to conserve energy throughout the day.   Warning Signs and Symptoms Group instruction provided by PowerPoint, verbal discussion, and written material to support subject matter. Instructor reviews warning signs and symptoms of stroke, heart attack, cold and flu. Instructor also reviews ways to prevent the spread of infection.   Other Education Group or individual verbal, written, or video instructions that support the educational goals of the pulmonary rehab program. Flowsheet Row PULMONARY REHAB CHRONIC OBSTRUCTIVE PULMONARY DISEASE from 06/02/2023 in [facility] for Heart, Vascular, & Lung Health  Date 06/02/23  Educator RT  Instruction Review Code 1- Verbalizes Understanding        Knowledge Questionnaire Score:  Knowledge Questionnaire Score - 05/13/23 1439       Knowledge Questionnaire Score   Pre Score 17/18             Core Components/Risk Factors/Patient Goals at Admission:  Personal Goals and Risk Factors at Admission - 05/13/23 1319       Core Components/Risk Factors/Patient Goals on Admission    Weight Management Yes;Weight Loss    Intervention Weight Management: Develop a combined nutrition and exercise program designed to reach desired caloric intake, while maintaining appropriate intake of nutrient and fiber, sodium and fats, and appropriate energy expenditure required for the weight goal.;Weight Management: Provide education and appropriate resources to help participant work on and attain dietary goals.;Weight Management/Obesity: Establish reasonable short term and long term weight goals.;Obesity: Provide education and appropriate resources to help participant work on and attain dietary goals.    Expected Outcomes Short Term: Continue to assess and modify interventions until short term weight is achieved;Long Term: Adherence to nutrition and physical  activity/exercise program aimed toward attainment of established weight goal;Weight Maintenance: Understanding of the daily nutrition guidelines, which includes 25-35% calories from fat, 7% or less cal from saturated fats, less than 200mg  cholesterol, less than 1.5gm of sodium, & 5 or more servings of fruits and vegetables daily;Weight Loss: Understanding of general recommendations for a balanced deficit meal plan, which promotes 1-2 lb weight loss per week and includes a negative energy balance of [id number] kcal/d;Understanding recommendations for meals to include 15-35% energy as protein, 25-35% energy from fat, 35-60% energy from carbohydrates, less than 200mg  of dietary cholesterol, 20-35 gm of total fiber daily;Understanding of distribution of calorie intake throughout the day with the consumption of 4-5 meals/snacks;Weight Gain: Understanding of general recommendations for a high calorie, high protein meal plan that promotes weight gain by distributing calorie intake throughout the day with the consumption  for 4-5 meals, snacks, and/or supplements    Tobacco Cessation Yes    Number of packs per day Kathrin states she still considers herself a smoker even though she has not had a cigarette in 17 weeks   Sammie has not smoked in 17 weeks   Intervention Assist the participant in steps to quit. Provide individualized education and counseling about committing to Tobacco Cessation, relapse prevention, and pharmacological support that can be provided by physician.;Education officer, environmental, assist with locating and accessing local/national Quit Smoking programs, and support quit date choice.    Expected Outcomes Short Term: Will demonstrate readiness to quit, by selecting a quit date.;Short Term: Will quit all tobacco product use, adhering to prevention of relapse plan.;Long Term: Complete abstinence from all tobacco products for at least 12 months from quit date.    Improve shortness of breath with ADL's Yes     Intervention Provide education, individualized exercise plan and daily activity instruction to help decrease symptoms of SOB with activities of daily living.    Expected Outcomes Short Term: Improve cardiorespiratory fitness to achieve a reduction of symptoms when performing ADLs;Long Term: Be able to perform more ADLs without symptoms or delay the onset of symptoms             Core Components/Risk Factors/Patient Goals Review:   Goals and Risk Factor Review     Row Name 05/25/23 0956 06/22/23 1144 07/05/23 0745 07/22/23 1032       Core Components/Risk Factors/Patient Goals Review   Personal Goals Review Weight Management/Obesity;Tobacco Cessation;Improve shortness of breath with ADL's;Develop more efficient breathing techniques such as purse lipped breathing and diaphragmatic breathing and practicing self-pacing with activity. Weight Management/Obesity;Tobacco Cessation;Improve shortness of breath with ADL's;Develop more efficient breathing techniques such as purse lipped breathing and diaphragmatic breathing and practicing self-pacing with activity. Tobacco Cessation Weight Management/Obesity;Tobacco Cessation;Improve shortness of breath with ADL's;Develop more efficient breathing techniques such as purse lipped breathing and diaphragmatic breathing and practicing self-pacing with activity.    Review This is Amatullah's first week in the program. Unable to assess her goal of weight loss yet. Devonna is working with our dietician to lower calorie consumption and eat a variety of healthy foods. Suellen's goals are progressing on decreasing her shortness of breath with ADLs and developing more efficient breathing techniques such as purse lipped breathing and diaphragmatic breathing; and practicing self-pacing with activity. We are teaching her these techniques and she is practicing them in class. Maressa's goal to quit smoking is progressing. She states she quit smoking 17 weeks ago, but still considers herself a  smoker. We are continuing to support her with [employer] materials. Goal not met weight loss yet. Currently, Marshawna is up in weight since her orientation. Tempy is working with our dietician to lower calorie consumption and eat a variety of healthy foods. Chinmayi's goals are progressing on decreasing her shortness of breath with ADLs and developing more efficient breathing techniques such as purse lipped breathing and diaphragmatic breathing; and practicing self-pacing with activity. We are teaching her these techniques and she is practicing them in class. Tashianna's goal to quit smoking is met. Even though she considers herself a smoker, she quit 23 weeks ago and hasn't relapsed. Discussed smoking abstinence with pt. She sts she has been quit for 5 months. Congratulated pt. She sts she still has cravings and she asks her friend to blow smoke in her face at times. Discussed with pt keeping on guard of relapse. Gave her resources and encouraged her  to look at the [employer] site for resources and support. She was appreciative. Goal not met weight loss yet. Currently, Caiah is up in weight since her orientation. She blames this on the fact she quit smoking. Brin is working with our dietician to lower calorie consumption and eat a variety of healthy foods. She has not been receptive to advice about her snacking. Angelamarie's goals are progressing on decreasing her shortness of breath with ADLs and developing more efficient breathing techniques such as purse lipped breathing and diaphragmatic breathing; and practicing self-pacing with activity. Unfortunately, Kamar had to be placed on oxygen during a recent illness, but we are working to wean her off. Shakiera's goal to quit smoking is met. She has not smoked for 5 months. She still has cravings but is working through them. Resources have been provided and we continue to support [place]. Khalyn has met her goal of developing more efficient breathing techniques such as purse lipped  breathing and diaphragmatic breathing; and practicing self-pacing with activity. Dijana can correctly demonstrate how to use the breathing techniques and practices them at home. She also knows how to self-pace herself while exercising and listens to her body.    Expected Outcomes For Thuy to lose weight, quit smoking, improve her shortness of breath with ADLs, and develop more efficient breathing techniques. For Jackye to lose weight, improve her shortness of breath with ADLs, develop more efficient breathing techniques, and learn how to self pace. For pt to stay quit smoking For Dayanni to lose weight, improve her shortness of breath with ADLs, develop more efficient breathing techniques, and learn how to self pace.             Core Components/Risk Factors/Patient Goals at Discharge (Final Review):   Goals and Risk Factor Review - 07/22/23 1032       Core Components/Risk Factors/Patient Goals Review   Personal Goals Review Weight Management/Obesity;Tobacco Cessation;Improve shortness of breath with ADL's;Develop more efficient breathing techniques such as purse lipped breathing and diaphragmatic breathing and practicing self-pacing with activity.    Review Goal not met weight loss yet. Currently, Daielle is up in weight since her orientation. She blames this on the fact she quit smoking. Blair is working with our dietician to lower calorie consumption and eat a variety of healthy foods. She has not been receptive to advice about her snacking. Laneisha's goals are progressing on decreasing her shortness of breath with ADLs and developing more efficient breathing techniques such as purse lipped breathing and diaphragmatic breathing; and practicing self-pacing with activity. Unfortunately, Shawndrika had to be placed on oxygen during a recent illness, but we are working to wean her off. Kristianna's goal to quit smoking is met. She has not smoked for 5 months. She still has cravings but is working through them. Resources have  been provided and we continue to support [place]. Azania has met her goal of developing more efficient breathing techniques such as purse lipped breathing and diaphragmatic breathing; and practicing self-pacing with activity. Doree can correctly demonstrate how to use the breathing techniques and practices them at home. She also knows how to self-pace herself while exercising and listens to her body.    Expected Outcomes For Kamyiah to lose weight, improve her shortness of breath with ADLs, develop more efficient breathing techniques, and learn how to self pace.             ITP Comments:   Comments: Pt is making expected progress toward Pulmonary Rehab goals after completing 20  session(s). Recommend continued exercise, life style modification, education, and utilization of breathing techniques to increase stamina and strength, while also decreasing shortness of breath with exertion.  Dr. Mechele Collin is Medical Director for Pulmonary Rehab at [facility].

## 2023-07-28 ENCOUNTER — Encounter (HOSPITAL_COMMUNITY)
Admission: RE | Admit: 2023-07-28 | Discharge: 2023-07-28 | Disposition: A | Discharge status: Home / Self Care | Payer: Medicare HMO | Source: Ambulatory Visit | Attending: Pulmonary Disease | Admitting: Pulmonary Disease

## 2023-07-28 DIAGNOSIS — J449 Chronic obstructive pulmonary disease, unspecified: Secondary | ICD-10-CM | POA: Diagnosis not present

## 2023-07-28 NOTE — Progress Notes (Signed)
Daily Session Note  Patient Details  Name: Sarah Harrell MRN: [id number] Date of Birth: 03/01/1941 Referring Provider:   Doristine Devoid Pulmonary Rehab Walk Test from 05/13/2023 in [facility] for Heart, Vascular, & Lung Health  Referring Provider Dewald       Encounter Date: 07/28/2023  Check In:  Session Check In - 07/28/23 1346       Check-In   Supervising physician immediately available to respond to emergencies [facility] MD immediately available    Physician(s) Carlyon Shadow, NP    Location [facility]    Staff Present Durel Salts, Patriciaann Clan, RN, BSN;Samantha Belarus, RD, Rexene Agent, MS, ACSM-CEP, Exercise Physiologist;Johnny Hale Bogus, MS, Exercise Physiologist    Virtual Visit No    Medication changes reported     No    Fall or balance concerns reported    No    Tobacco Cessation No Change    Warm-up and Cool-down Performed as group-led instruction    Resistance Training Performed Yes    VAD Patient? No    PAD/SET Patient? No      Pain Assessment   Currently in Pain? No/denies    Multiple Pain Sites No             Capillary Blood Glucose: No results found for this or any previous visit (from the past 24 hour(s)).    Social History   Tobacco Use  Smoking Status Former   Types: Cigarettes  Smokeless Tobacco Never  Tobacco Comments   Pt quit smoking 12-27-2022.    Goals Met:  Proper associated with RPD/PD & O2 Sat Independence with exercise equipment Exercise tolerated well No report of concerns or symptoms today Strength training completed today  Goals Unmet:  Not Applicable  Comments: Service time is from 1318 to 1438.    Dr. Mechele Collin is Medical Director for Pulmonary Rehab at [facility].

## 2023-08-02 ENCOUNTER — Encounter (HOSPITAL_COMMUNITY)
Admission: RE | Admit: 2023-08-02 | Discharge: 2023-08-02 | Disposition: A | Discharge status: Home / Self Care | Payer: Medicare HMO | Source: Ambulatory Visit | Attending: Pulmonary Disease | Admitting: Pulmonary Disease

## 2023-08-02 DIAGNOSIS — J449 Chronic obstructive pulmonary disease, unspecified: Secondary | ICD-10-CM | POA: Diagnosis not present

## 2023-08-02 NOTE — Progress Notes (Signed)
Daily Session Note  Patient Details  Name: Sarah Harrell MRN: [id number] Date of Birth: Jun 22, 1941 Referring Provider:   Doristine Devoid Pulmonary Rehab Walk Test from 05/13/2023 in [facility] for Heart, Vascular, & Lung Health  Referring Provider Dewald       Encounter Date: 08/02/2023  Check In:  Session Check In - 08/02/23 1410       Check-In   Supervising physician immediately available to respond to emergencies [facility] MD immediately available    Physician(s) Carlyon Shadow, NP    [facility]    Staff Present Durel Salts, Patriciaann Clan, RN, BSN;Samantha Belarus, RD, Rexene Agent, MS, ACSM-CEP, Exercise Physiologist;Randi Dionisio Paschal, ACSM-CEP, Exercise Physiologist    Virtual Visit No    Medication changes reported     No    Fall or balance concerns reported    No    Tobacco Cessation No Change    Warm-up and Cool-down Performed as group-led instruction    Resistance Training Performed Yes    VAD Patient? No    PAD/SET Patient? No      Pain Assessment   Currently in Pain? No/denies    Multiple Pain Sites No             Capillary Blood Glucose: No results found for this or any previous visit (from the past 24 hour(s)).    Social History   Tobacco Use  Smoking Status Former   Types: Cigarettes  Smokeless Tobacco Never  Tobacco Comments   Pt quit smoking 12-27-2022.    Goals Met:  Proper associated with RPD/PD & O2 Sat Independence with exercise equipment Exercise tolerated well No report of concerns or symptoms today Strength training completed today  Goals Unmet:  Not Applicable  Comments: Service time is from 1308 to 1445.    Dr. Mechele Collin is Medical Director for Pulmonary Rehab at [facility].

## 2023-08-04 ENCOUNTER — Encounter (HOSPITAL_COMMUNITY)
Admission: RE | Admit: 2023-08-04 | Discharge: 2023-08-04 | Disposition: A | Discharge status: Home / Self Care | Payer: Medicare HMO | Source: Ambulatory Visit | Attending: Pulmonary Disease | Admitting: Pulmonary Disease

## 2023-08-04 DIAGNOSIS — J449 Chronic obstructive pulmonary disease, unspecified: Secondary | ICD-10-CM | POA: Diagnosis not present

## 2023-08-04 DIAGNOSIS — J432 Centrilobular emphysema: Secondary | ICD-10-CM | POA: Diagnosis not present

## 2023-08-04 NOTE — Progress Notes (Signed)
Daily Session Note  Patient Details  Name: Sarah Harrell MRN: [id number] Date of Birth: [birth year] Referring Provider:   Doristine Devoid Pulmonary Rehab Walk Test from 05/13/2023 in [facility] for Heart, Vascular, & Lung Health  Referring Provider Dewald       Encounter Date: 08/04/2023  Check In:  Session Check In - 08/04/23 1328       Check-In   Supervising physician immediately available to respond to emergencies [facility] MD immediately available    Physician(s) Robin Searing, NP    Location [facility]    Staff Present Durel Salts, Patriciaann Clan, RN, Doris Cheadle, MS, ACSM-CEP, Exercise Physiologist;Randi Dionisio Paschal, ACSM-CEP, Exercise Physiologist    Virtual Visit No    Medication changes reported     No    Fall or balance concerns reported    No    Tobacco Cessation No Change    Warm-up and Cool-down Performed as group-led instruction    Resistance Training Performed Yes    VAD Patient? No    PAD/SET Patient? No      Pain Assessment   Currently in Pain? No/denies    Multiple Pain Sites No             Capillary Blood Glucose: No results found for this or any previous visit (from the past 24 hour(s)).    Social History   Tobacco Use  Smoking Status Former   Types: Cigarettes  Smokeless Tobacco Never  Tobacco Comments   Pt quit smoking 12-27-2022.    Goals Met:  Proper associated with RPD/PD & O2 Sat Independence with exercise equipment Exercise tolerated well No report of concerns or symptoms today Strength training completed today  Goals Unmet:  Not Applicable  Comments: Service time is from 1307 to 1445.    Dr. Mechele Collin is Medical Director for Pulmonary Rehab at [facility].

## 2023-08-07 ENCOUNTER — Telehealth: Payer: Self-pay | Admitting: Primary Care

## 2023-08-07 NOTE — Telephone Encounter (Signed)
Can we please call radiology and inquire about CXR that was done 2 weeks ago, no results

## 2023-08-09 ENCOUNTER — Encounter (HOSPITAL_COMMUNITY)
Admission: RE | Admit: 2023-08-09 | Discharge: 2023-08-09 | Disposition: A | Discharge status: Home / Self Care | Payer: Medicare HMO | Source: Ambulatory Visit | Attending: Pulmonary Disease | Admitting: Pulmonary Disease

## 2023-08-09 VITALS — Wt 131.0 lb

## 2023-08-09 DIAGNOSIS — J449 Chronic obstructive pulmonary disease, unspecified: Secondary | ICD-10-CM | POA: Diagnosis not present

## 2023-08-09 NOTE — Progress Notes (Signed)
Daily Session Note  Patient Details  Name: Sarah Harrell MRN: [id number] Date of Birth: 13-Jul-1941 Referring Provider:   Doristine Devoid Pulmonary Rehab Walk Test from 05/13/2023 in [facility] for Heart, Vascular, & Lung Health  Referring Provider Dewald       Encounter Date: 08/09/2023  Check In:  Session Check In - 08/09/23 1327       Check-In   Supervising physician immediately available to respond to emergencies [facility] MD immediately available    Physician(s) Lorin Picket, NP    Location [facility]    Staff Present Durel Salts, Patriciaann Clan, RN, Doris Cheadle, MS, ACSM-CEP, Exercise Physiologist;Randi Dionisio Paschal, ACSM-CEP, Exercise Physiologist    Virtual Visit No    Medication changes reported     No    Fall or balance concerns reported    No    Tobacco Cessation No Change    Warm-up and Cool-down Performed as group-led instruction    Resistance Training Performed Yes    VAD Patient? No    PAD/SET Patient? No      Pain Assessment   Currently in Pain? No/denies    Multiple Pain Sites No             Capillary Blood Glucose: No results found for this or any previous visit (from the past 24 hour(s)).   Exercise Prescription Changes - 08/09/23 1400       Response to Exercise   Blood Pressure (Admit) 112/66    Blood Pressure (Exercise) 152/64    Blood Pressure (Exit) 106/54    Heart Rate (Admit) 97 bpm    Heart Rate (Exercise) 122 bpm    Heart Rate (Exit) 108 bpm    Oxygen Saturation (Admit) 96 %    Oxygen Saturation (Exercise) 88 %    Oxygen Saturation (Exit) 91 %    Rating of Perceived Exertion (Exercise) 13    Perceived Dyspnea (Exercise) 3    Duration Continue with 30 min of aerobic exercise without signs/symptoms of physical distress.    Intensity THRR unchanged      Progression   Progression Continue to progress workloads to maintain intensity without signs/symptoms of physical distress.      Resistance  Training   Training Prescription Yes    Weight blue bands    Reps 10-15    Time 10 Minutes      Treadmill   MPH 2.4    Grade 2.5    Minutes 15    METs 3.83      Recumbant Elliptical   Level 5    Minutes 15    METs 3.5             Social History   Tobacco Use  Smoking Status Former   Types: Cigarettes  Smokeless Tobacco Never  Tobacco Comments   Pt quit smoking 12-27-2022.    Goals Met:  Proper associated with RPD/PD & O2 Sat Independence with exercise equipment Exercise tolerated well No report of concerns or symptoms today Strength training completed today  Goals Unmet:  Not Applicable  Comments: Service time is from 1305 to 1445.    Dr. Mechele Collin is Medical Director for Pulmonary Rehab at [facility].

## 2023-08-09 NOTE — Telephone Encounter (Signed)
Called [place] radiology and spoke with French Ana- asked to have cxr from 07/26/23 read. Will route to me for continued fu

## 2023-08-10 ENCOUNTER — Ambulatory Visit
Admission: RE | Admit: 2023-08-10 | Discharge: 2023-08-10 | Disposition: A | Discharge status: Home / Self Care | Payer: Medicare HMO | Source: Ambulatory Visit | Attending: Internal Medicine | Admitting: Internal Medicine

## 2023-08-10 ENCOUNTER — Telehealth: Payer: Self-pay | Admitting: Primary Care

## 2023-08-10 DIAGNOSIS — N958 Other specified menopausal and perimenopausal disorders: Secondary | ICD-10-CM | POA: Diagnosis not present

## 2023-08-10 DIAGNOSIS — E2839 Other primary ovarian failure: Secondary | ICD-10-CM | POA: Diagnosis not present

## 2023-08-10 DIAGNOSIS — M8588 Other specified disorders of bone density and structure, other site: Secondary | ICD-10-CM | POA: Diagnosis not present

## 2023-08-10 NOTE — Telephone Encounter (Signed)
See last signed encounter. PT states she got another call w/results after Verlon Au called her. Pls call to advise what "Suggestive of COPD" means on last Rads report.  [phone]

## 2023-08-10 NOTE — Progress Notes (Signed)
CXR showed no evidence of acute cardiopulmonary disease, unchanged findings consistent with COPD

## 2023-08-10 NOTE — Telephone Encounter (Signed)
Cxr has been read:  CXR showed no evidence of acute cardiopulmonary disease, unchanged findings consistent with COPD   Spoke with pt and notified of results per [facility]. Pt verbalized understanding and denied any questions.

## 2023-08-11 ENCOUNTER — Encounter (HOSPITAL_COMMUNITY)
Admission: RE | Admit: 2023-08-11 | Discharge: 2023-08-11 | Disposition: A | Discharge status: Home / Self Care | Payer: Medicare HMO | Source: Ambulatory Visit | Attending: Pulmonary Disease | Admitting: Pulmonary Disease

## 2023-08-11 DIAGNOSIS — J449 Chronic obstructive pulmonary disease, unspecified: Secondary | ICD-10-CM | POA: Diagnosis not present

## 2023-08-11 NOTE — Progress Notes (Signed)
Discharge Progress Report  Patient Details  Name: Sarah Harrell MRN: [id number] Date of Birth: 29-Jun-1941 Referring Provider:   Doristine Devoid Pulmonary Rehab Walk Test from 05/13/2023 in [facility]  Referring Provider Dewald        Number of Visits: 25  Reason for Discharge:  Patient reached a stable level of exercise. Patient independent in their exercise. Patient has met program and personal goals.  Smoking History:  Social History   Tobacco Use  Smoking Status Former   Types: Cigarettes  Smokeless Tobacco Never  Tobacco Comments   Pt quit smoking 12-27-2022.    Diagnosis:  Stage 3 severe COPD by GOLD classification (HCC)  ADL [facility]:  Pulmonary Assessment Scores     Row Name 05/13/23 1439 08/04/23 1422       ADL [facility]SD   ADL Phase Entry Exit    SOB Score total 23 12      CAT Score   CAT Score 12 7      mMRC Score   mMRC Score 3 0             Initial Exercise Prescription:  Initial Exercise Prescription - 05/13/23 1400       Date of Initial Exercise RX and Referring Provider   Date 05/13/23    Referring Provider Dewald    Expected Discharge Date 08/11/23      Treadmill   MPH 2    Grade 0    Minutes 15    METs 2.53      Bike   Level 1    Minutes 15    METs 2.5      Prescription Details   Frequency (times per week) 2    Duration Progress to 30 minutes of continuous aerobic without signs/symptoms of physical distress      Intensity   THRR 40-80% of Max Heartrate 56-112    Ratings of Perceived Exertion 11-13    Perceived Dyspnea 0-4      Progression   Progression Continue to progress workloads to maintain intensity without signs/symptoms of physical distress.      Resistance Training   Training Prescription Yes    Weight blue bands    Reps 10-15             Discharge Exercise Prescription (Final Exercise Prescription Changes):  Exercise Prescription Changes - 08/09/23  1400       Response to Exercise   Blood Pressure (Admit) 112/66    Blood Pressure (Exercise) 152/64    Blood Pressure (Exit) 106/54    Heart Rate (Admit) 97 bpm    Heart Rate (Exercise) 122 bpm    Heart Rate (Exit) 108 bpm    Oxygen Saturation (Admit) 96 %    Oxygen Saturation (Exercise) 88 %    Oxygen Saturation (Exit) 91 %    Rating of Perceived Exertion (Exercise) 13    Perceived Dyspnea (Exercise) 3    Duration Continue with 30 min of aerobic exercise without signs/symptoms of physical distress.    Intensity THRR unchanged      Progression   Progression Continue to progress workloads to maintain intensity without signs/symptoms of physical distress.      Resistance Training   Training Prescription Yes    Weight blue bands    Reps 10-15    Time 10 Minutes      Treadmill   MPH 2.4    Grade 2.5    Minutes  15    METs 3.83      Recumbant Elliptical   Level 5    Minutes 15    METs 3.5             Functional Capacity:  6 Minute Walk     Row Name 05/13/23 1427 08/11/23 1419       6 Minute Walk   Phase Initial Discharge    Distance 1320 feet 1512 feet    Distance % Change -- 14.55 %    Distance Feet Change -- 192 ft    Walk Time 6 minutes 6 minutes    # of Rest Breaks 0 0    MPH 2.5 2.86    METS 2.63 3.02    RPE 11 14    Perceived Dyspnea  1.5 3    VO2 Peak 9.19 10.57    Symptoms No No    Resting HR 93 bpm 93 bpm    Resting BP 118/58 122/68    Resting Oxygen Saturation  95 % 96 %    Exercise Oxygen Saturation  during 6 min walk 89 % 86 %    Max Ex. HR 116 bpm 125 bpm    Max Ex. BP 138/52 142/54    2 Minute Post BP 132/58 128/54      Interval HR   1 Minute HR 102 101    2 Minute HR 102 103    3 Minute HR 105 110    4 Minute HR 108 111    5 Minute HR 116 117    6 Minute HR 114 125    2 Minute Post HR 98 106    Interval Heart Rate? Yes Yes      Interval Oxygen   Interval Oxygen? Yes --    Baseline Oxygen Saturation % 95 % 96 %    1 Minute  Oxygen Saturation % 95 % 92 %    1 Minute Liters of Oxygen 0 L 0 L    2 Minute Oxygen Saturation % 90 % 88 %    2 Minute Liters of Oxygen 0 L 0 L    3 Minute Oxygen Saturation % 89 % 93 %  86% @ 2:40    3 Minute Liters of Oxygen 0 L 0 L  increased 1L    4 Minute Oxygen Saturation % 92 % 92 %    4 Minute Liters of Oxygen 0 L 1 L    5 Minute Oxygen Saturation % 91 % 90 %    5 Minute Liters of Oxygen 0 L 1 L    6 Minute Oxygen Saturation % 91 % 8 %    6 Minute Liters of Oxygen 0 L 1 L    2 Minute Post Oxygen Saturation % 96 % 95 %    2 Minute Post Liters of Oxygen 0 L 1 L             Psychological, QOL, Others - Outcomes: PHQ 2/9:    08/04/2023    2:22 PM 05/13/2023    2:11 PM  Depression screen PHQ 2/9  Decreased Interest 0 0  Down, Depressed, Hopeless 0 0  PHQ - 2 Score 0 0  Altered sleeping 1 1  Tired, decreased energy 0 1  Change in appetite 0 0  Feeling bad or failure about yourself  0 0  Trouble concentrating 0 0  Moving slowly or fidgety/restless 0 0  Suicidal thoughts 0 0  PHQ-9 Score 1 2  Difficult doing work/chores Not difficult at all Not difficult at all    Quality of Life:   Personal Goals: Goals established at orientation with interventions provided to work toward goal.  Personal Goals and Risk Factors at Admission - 05/13/23 1319       Core Components/Risk Factors/Patient Goals on Admission    Weight Management Yes;Weight Loss    Intervention Weight Management: Develop a combined nutrition and exercise program designed to reach desired caloric intake, while maintaining appropriate intake of nutrient and fiber, sodium and fats, and appropriate energy expenditure required for the weight goal.;Weight Management: Provide education and appropriate resources to help participant work on and attain dietary goals.;Weight Management/Obesity: Establish reasonable short term and long term weight goals.;Obesity: Provide education and appropriate resources to help  participant work on and attain dietary goals.    Expected Outcomes Short Term: Continue to assess and modify interventions until short term weight is achieved;Long Term: Adherence to nutrition and physical activity/exercise program aimed toward attainment of established weight goal;Weight Maintenance: Understanding of the daily nutrition guidelines, which includes 25-35% calories from fat, 7% or less cal from saturated fats, less than 200mg  cholesterol, less than 1.5gm of sodium, & 5 or more servings of fruits and vegetables daily;Weight Loss: Understanding of general recommendations for a balanced deficit meal plan, which promotes 1-2 lb weight loss per week and includes a negative energy balance of [phone] kcal/d;Understanding recommendations for meals to include 15-35% energy as protein, 25-35% energy from fat, 35-60% energy from carbohydrates, less than 200mg  of dietary cholesterol, 20-35 gm of total fiber daily;Understanding of distribution of calorie intake throughout the day with the consumption of 4-5 meals/snacks;Weight Gain: Understanding of general recommendations for a high calorie, high protein meal plan that promotes weight gain by distributing calorie intake throughout the day with the consumption for 4-5 meals, snacks, and/or supplements    Tobacco Cessation Yes    Number of packs per day Jasmond states she still considers herself a smoker even though she has not had a cigarette in 17 weeks   Gloriana has not smoked in 17 weeks   Intervention Assist the participant in steps to quit. Provide individualized education and counseling about committing to Tobacco Cessation, relapse prevention, and pharmacological support that can be provided by physician.;Education officer, environmental, assist with locating and accessing local/national Quit Smoking programs, and support quit date choice.    Expected Outcomes Short Term: Will demonstrate readiness to quit, by selecting a quit date.;Short Term: Will quit all  tobacco product use, adhering to prevention of relapse plan.;Long Term: Complete abstinence from all tobacco products for at least 12 months from quit date.    Improve shortness of breath with ADL's Yes    Intervention Provide education, individualized exercise plan and daily activity instruction to help decrease symptoms of SOB with activities of daily living.    Expected Outcomes Short Term: Improve cardiorespiratory fitness to achieve a reduction of symptoms when performing ADLs;Long Term: Be able to perform more ADLs without symptoms or delay the onset of symptoms              Personal Goals Discharge:  Goals and Risk Factor Review     Row Name 05/25/23 5638 06/22/23 1144 07/05/23 0745 07/22/23 1032 08/11/23 1558     Core Components/Risk Factors/Patient Goals Review   Personal Goals Review Weight Management/Obesity;Tobacco Cessation;Improve shortness of breath with ADL's;Develop more efficient breathing techniques such as purse lipped breathing and diaphragmatic breathing and practicing self-pacing with activity. Weight Management/Obesity;Tobacco  Cessation;Improve shortness of breath with ADL's;Develop more efficient breathing techniques such as purse lipped breathing and diaphragmatic breathing and practicing self-pacing with activity. Tobacco Cessation Weight Management/Obesity;Tobacco Cessation;Improve shortness of breath with ADL's;Develop more efficient breathing techniques such as purse lipped breathing and diaphragmatic breathing and practicing self-pacing with activity. Weight Management/Obesity;Tobacco Cessation;Improve shortness of breath with ADL's;Develop more efficient breathing techniques such as purse lipped breathing and diaphragmatic breathing and practicing self-pacing with activity.   Review This is Kaileigh's first week in the program. Unable to assess her goal of weight loss yet. Mashayla is working with our dietician to lower calorie consumption and eat a variety of healthy foods.  Katlin's goals are progressing on decreasing her shortness of breath with ADLs and developing more efficient breathing techniques such as purse lipped breathing and diaphragmatic breathing; and practicing self-pacing with activity. We are teaching her these techniques and she is practicing them in class. Dorlis's goal to quit smoking is progressing. She states she quit smoking 17 weeks ago, but still considers herself a smoker. We are continuing to support her with [employer] materials. Goal not met weight loss yet. Currently, Collyns is up in weight since her orientation. Kamira is working with our dietician to lower calorie consumption and eat a variety of healthy foods. Cleotha's goals are progressing on decreasing her shortness of breath with ADLs and developing more efficient breathing techniques such as purse lipped breathing and diaphragmatic breathing; and practicing self-pacing with activity. We are teaching her these techniques and she is practicing them in class. Teyanna's goal to quit smoking is met. Even though she considers herself a smoker, she quit 23 weeks ago and hasn't relapsed. Discussed smoking abstinence with pt. She sts she has been quit for 5 months. Congratulated pt. She sts she still has cravings and she asks her friend to blow smoke in her face at times. Discussed with pt keeping on guard of relapse. Gave her resources and encouraged her to look at the [employer] site for resources and support. She was appreciative. Goal not met weight loss yet. Currently, Bionka is up in weight since her orientation. She blames this on the fact she quit smoking. Tiyana is working with our dietician to lower calorie consumption and eat a variety of healthy foods. She has not been receptive to advice about her snacking. Adelita's goals are progressing on decreasing her shortness of breath with ADLs and developing more efficient breathing techniques such as purse lipped breathing and diaphragmatic breathing; and  practicing self-pacing with activity. Unfortunately, Kwan had to be placed on oxygen during a recent illness, but we are working to wean her off. Laelah's goal to quit smoking is met. She has not smoked for 5 months. She still has cravings but is working through them. Resources have been provided and we continue to support [place]. Arelene has met her goal of developing more efficient breathing techniques such as purse lipped breathing and diaphragmatic breathing; and practicing self-pacing with activity. Dewanna can correctly demonstrate how to use the breathing techniques and practices them at home. She also knows how to self-pace herself while exercising and listens to her body. Linzi graduated the [employer] on 08/11/23 completing 25 sessions. Core components/risk factors/patient goals review are as follows: Goal not met weight loss. Currently, Asta is up in weight since her orientation. She blames this on the fact she quit smoking. Venida was working with our dietician to lower calorie consumption and eat a variety of healthy foods, but she was not receptive  to advice about her snacking. Hildy's goal of decreasing her shortness of breath with ADLs and developing more efficient breathing techniques such as purse lipped breathing and diaphragmatic breathing; and practicing self-pacing with activity were not met. Unfortunately, Makelle had to be placed on oxygen during a recent illness and we were unable to wean her off during exercise. Tamia was able to increase her workload and METs. We have ordered her home oxygen. Nikeria made good progress in the program and we are proud of her success!   Expected Outcomes For Adrinna to lose weight, quit smoking, improve her shortness of breath with ADLs, and develop more efficient breathing techniques. For Erena to lose weight, improve her shortness of breath with ADLs, develop more efficient breathing techniques, and learn how to self pace. For pt to stay quit smoking For Symone to lose  weight, improve her shortness of breath with ADLs, develop more efficient breathing techniques, and learn how to self pace. For Ismenia to continue to work on her goals of losing weight, improving her shortness of breath with ADLs, and developing more efficient breathing techniques after graduation from [employer]            Exercise Goals and Review:  Exercise Goals     Row Name 05/13/23 1321 05/24/23 0846           Exercise Goals   Increase Physical Activity Yes Yes      Intervention Provide advice, education, support and counseling about physical activity/exercise needs.;Develop an individualized exercise prescription for aerobic and resistive training based on initial evaluation findings, risk stratification, comorbidities and participant's personal goals. Provide advice, education, support and counseling about physical activity/exercise needs.;Develop an individualized exercise prescription for aerobic and resistive training based on initial evaluation findings, risk stratification, comorbidities and participant's personal goals.      Expected Outcomes Short Term: Attend rehab on a regular basis to increase amount of physical activity.;Long Term: Add in home exercise to make exercise part of routine and to increase amount of physical activity.;Long Term: Exercising regularly at least 3-5 days a week. Short Term: Attend rehab on a regular basis to increase amount of physical activity.;Long Term: Add in home exercise to make exercise part of routine and to increase amount of physical activity.;Long Term: Exercising regularly at least 3-5 days a week.      Increase Strength and Stamina Yes Yes      Intervention Provide advice, education, support and counseling about physical activity/exercise needs.;Develop an individualized exercise prescription for aerobic and resistive training based on initial evaluation findings, risk stratification, comorbidities and participant's personal goals. Provide advice,  education, support and counseling about physical activity/exercise needs.;Develop an individualized exercise prescription for aerobic and resistive training based on initial evaluation findings, risk stratification, comorbidities and participant's personal goals.      Expected Outcomes Short Term: Increase workloads from initial exercise prescription for resistance, speed, and METs.;Short Term: Perform resistance training exercises routinely during rehab and add in resistance training at home;Long Term: Improve cardiorespiratory fitness, muscular endurance and strength as measured by increased METs and functional capacity ( ) Short Term: Increase workloads from initial exercise prescription for resistance, speed, and METs.;Short Term: Perform resistance training exercises routinely during rehab and add in resistance training at home;Long Term: Improve cardiorespiratory fitness, muscular endurance and strength as measured by increased METs and functional capacity ( )      Able to understand and use rate of perceived exertion (RPE) scale Yes Yes      Intervention Provide  education and explanation on how to use RPE scale Provide education and explanation on how to use RPE scale      Expected Outcomes Short Term: Able to use RPE daily in rehab to express subjective intensity level;Long Term:  Able to use RPE to guide intensity level when exercising independently Short Term: Able to use RPE daily in rehab to express subjective intensity level;Long Term:  Able to use RPE to guide intensity level when exercising independently      Able to understand and use Dyspnea scale Yes Yes      Intervention Provide education and explanation on how to use Dyspnea scale Provide education and explanation on how to use Dyspnea scale      Expected Outcomes Short Term: Able to use Dyspnea scale daily in rehab to express subjective sense of shortness of breath during exertion;Long Term: Able to use Dyspnea scale to guide  intensity level when exercising independently Short Term: Able to use Dyspnea scale daily in rehab to express subjective sense of shortness of breath during exertion;Long Term: Able to use Dyspnea scale to guide intensity level when exercising independently      Knowledge and understanding of Target Heart Rate Range (THRR) Yes Yes      Intervention Provide education and explanation of THRR including how the numbers were predicted and where they are located for reference Provide education and explanation of THRR including how the numbers were predicted and where they are located for reference      Expected Outcomes Short Term: Able to state/look up THRR;Long Term: Able to use THRR to govern intensity when exercising independently;Short Term: Able to use daily as guideline for intensity in rehab Short Term: Able to state/look up THRR;Long Term: Able to use THRR to govern intensity when exercising independently;Short Term: Able to use daily as guideline for intensity in rehab      Understanding of Exercise Prescription Yes Yes      Intervention Provide education, explanation, and written materials on patient's individual exercise prescription Provide education, explanation, and written materials on patient's individual exercise prescription      Expected Outcomes Short Term: Able to explain program exercise prescription;Long Term: Able to explain home exercise prescription to exercise independently Short Term: Able to explain program exercise prescription;Long Term: Able to explain home exercise prescription to exercise independently               Exercise Goals Re-Evaluation:  Exercise Goals Re-Evaluation     Row Name 05/24/23 0846 06/21/23 0915 07/18/23 1500 08/11/23 1532       Exercise Goal Re-Evaluation   Exercise Goals Review Increase Physical Activity;Able to understand and use Dyspnea scale;Understanding of Exercise Prescription;Increase Strength and Stamina;Knowledge and understanding of  Target Heart Rate Range (THRR);Able to understand and use rate of perceived exertion (RPE) scale Increase Physical Activity;Able to understand and use Dyspnea scale;Understanding of Exercise Prescription;Increase Strength and Stamina;Knowledge and understanding of Target Heart Rate Range (THRR);Able to understand and use rate of perceived exertion (RPE) scale Increase Physical Activity;Able to understand and use Dyspnea scale;Understanding of Exercise Prescription;Increase Strength and Stamina;Knowledge and understanding of Target Heart Rate Range (THRR);Able to understand and use rate of perceived exertion (RPE) scale Increase Physical Activity;Able to understand and use Dyspnea scale;Understanding of Exercise Prescription;Increase Strength and Stamina;Knowledge and understanding of Target Heart Rate Range (THRR);Able to understand and use rate of perceived exertion (RPE) scale    Comments Persephany has completed 1 day of exercise. She walked on TM for 15  min, 2.0 mph, 0 incline, 2.3 METs. She then exercised on the recumbent elliptical for 15 min, level 2, METs 1.3. tolerated well. Performed warm up and cool down unlimited. Will begin to progress as tolerated. Danajah has completed 9 days of exercise. She is walking on the TM for 15 min, 2.2 mph, 1.0 incline, 2.3, 2.99 METs. She then is exercising on the recumbent elliptical for 15 min, level 2, METs 3.1. She is progressing well and her workloads will be increased today.  Performs warm up and cool down unlimited. Will  progress as tolerated. Lakeidra has completed 17 days of exercise. She has perfect attendance. She was walking on the TM for 15 min, 2.3 mph, 2.0 incline,  3.4 METs however she has become wheezy and hypoxic and preferred to decrease speed and incline temporarily.  She then is exercising on the recumbent elliptical for 15 min, level 4, METs 3.8, which she has tolerated progression well.   Will  progress as tolerated, she is motivated but is resistant to  supplemental O2. Akane completed 25 days of exercise with perfect attendance. She walked on the TM for 15 min, 2.4 mph, 2.5 incline,  3.66 METs.  She then exercised on the recumbent elliptical for 15 min, level 5, METs 4.2. She increased her 6 min walk test by 14.5 %, 192 ft. Her grip strength did not increase. Her SOB scale decreased from 23 to 12 and her CAT scale decreased from 12 to 7. Pt plans to continue to exercise.    Expected Outcomes Through exercise at rehab and at home, the patient will decrease shortness of breath with daily activities and feel confident in carrying out an exercise regime at home. Through exercise at rehab and at home, the patient will decrease shortness of breath with daily activities and feel confident in carrying out an exercise regime at home. Through exercise at rehab and at home, the patient will decrease shortness of breath with daily activities and feel confident in carrying out an exercise regime at home. Through exercise at rehab and at home, the patient will decrease shortness of breath with daily activities and feel confident in carrying out an exercise regime at home.             Nutrition & Weight - Outcomes:  Pre Biometrics - 05/13/23 1444       Pre Biometrics   Grip Strength 20 kg              Nutrition:  Nutrition Therapy & Goals - 07/19/23 1413       Nutrition Therapy   Diet General Healthy Diet    Drug/Food Interactions Statins/Certain Fruits      Personal Nutrition Goals   Nutrition Goal Patient to improve diet quality by using the plate method as a guide for meal planning to include lean protein/plant protein, fruits, vegetables, whole grains, nonfat dairy as part of a well-balanced diet.   Goal in progress.   Comments Goal in progress. Rhilynn reports eating a wide variety of foods. She does crave carbs and she does not enjoy cooking. She is up 5.7# since starting with our program;she reports her typical weight is 112-116#. Per  documenation, she is up ~15# since May 2024 at 113# at pulmonology visit on 02/01/23. No new labs at this time; triglycerides have been elevated. She reports concern about weight gain; however, she remains contemplative toward changing snacking behaviors. She does report snacking on peanut butter in the middle of the night. Patient  will benefit from participation in pulmonary rehab for nutrition, exercise, and lifestyle modification.      Intervention Plan   Intervention Prescribe, educate and counsel regarding individualized specific dietary modifications aiming towards targeted core components such as weight, hypertension, lipid management, diabetes, heart failure and other comorbidities.;Nutrition handout(s) given to patient.    Expected Outcomes Short Term Goal: Understand basic principles of dietary content, such as calories, fat, sodium, cholesterol and nutrients.;Long Term Goal: Adherence to prescribed nutrition plan.             Nutrition Discharge:  Nutrition Assessments - 08/05/23 1042       Rate Your Plate Scores   Pre Score 66    Post Score 64             Education Questionnaire Score:  Knowledge Questionnaire Score - 08/04/23 1422       Knowledge Questionnaire Score   Post Score 17/18            Hawley graduated the PR program on 08/11/2023 completing 25 sessions. Psychosocial re-evaluation is as follows: Her PHQ 2-9 scores decreased from a 0-2 to 0-1. Reta denies any psychosocial barriers or concerns at the time of re-evaluation at discharge. She denies any needs.   Core components/risk factors/patient goals review are as follows: Goal not met weight loss. Currently, Mariella is up in weight since her orientation. She blames this on the fact she quit smoking. Kennita was working with our dietician to lower calorie consumption and eat a variety of healthy foods, but she was not receptive to advice about her snacking. Anona's goal of decreasing her shortness of breath with  ADLs and developing more efficient breathing techniques such as purse lipped breathing and diaphragmatic breathing; and practicing self-pacing with activity were not met. Unfortunately, Senia had to be placed on oxygen during a recent illness and we were unable to wean her off during exercise. Alberta was able to increase her workload and METs. We have ordered her home oxygen. Betteann made good progress in the program and we are proud of her success!    Goals reviewed with patient; copy given to patient.

## 2023-08-11 NOTE — Progress Notes (Signed)
   08/11/23 1419  6 Minute Walk  Phase Discharge  Distance 1512 feet  Distance % Change 14.55 %  Distance Feet Change 192 ft  Walk Time 6 minutes  # of Rest Breaks 0  MPH 2.86  METS 3.02  RPE 14  Perceived Dyspnea  3  VO2 Peak 10.57  Symptoms No  Resting HR 93 bpm  Resting BP 122/68  Resting Oxygen Saturation  96 %  Exercise Oxygen Saturation  during 6 min walk 86 %  Max Ex. HR 125 bpm  Max Ex. BP 142/54  2 Minute Post BP 128/54  Interval HR  Interval Heart Rate? Yes  1 Minute HR 101  2 Minute HR 103  3 Minute HR 110  4 Minute HR 111  5 Minute HR 117  6 Minute HR 125  2 Minute Post HR 106  Interval Oxygen  Baseline Oxygen Saturation % 96 %  1 Minute Oxygen Saturation % 92 %  1 Minute Liters of Oxygen 0 L  2 Minute Oxygen Saturation % 88 %  2 Minute Liters of Oxygen 0 L  3 Minute Oxygen Saturation % 93 % (86% @ 2:40)  3 Minute Liters of Oxygen 0 L (increased 1L)  4 Minute Oxygen Saturation % 92 %  4 Minute Liters of Oxygen 1 L  5 Minute Oxygen Saturation % 90 %  5 Minute Liters of Oxygen 1 L  6 Minute Oxygen Saturation % 8 %  6 Minute Liters of Oxygen 1 L  2 Minute Post Oxygen Saturation % 95 %  2 Minute Post Liters of Oxygen 1 L    Pt SpO2 to 86 RA on 6 min walk test and required 1L to increase. She would benefit from portable oxygen for exercise as needed. She has been able to do pulmonary rehab exercise sessions without O2 but is 88-89 RA at times.  Ethelda Chick BS, ACSM-CEP 08/11/2023 3:48 PM

## 2023-08-11 NOTE — Progress Notes (Signed)
Daily Session Note  Patient Details  Name: Sarah Harrell MRN: [id number] Date of Birth: [birth year] Referring Provider:   Doristine Devoid Pulmonary Rehab Walk Test from 05/13/2023 in [facility]  Referring Provider Dewald       Encounter Date: 08/11/2023  Check In:  Session Check In - 08/11/23 1337       Check-In   Supervising physician immediately available to respond to emergencies [facility] MD immediately available    Physician(s) Bernadene Person, NP    Location [facility]    Staff Present Durel Salts, Zella Richer, MS, ACSM-CEP, Exercise Physiologist;Randi Dionisio Paschal, ACSM-CEP, Exercise Physiologist    Virtual Visit No    Medication changes reported     No    Fall or balance concerns reported    No    Tobacco Cessation No Change    Warm-up and Cool-down Performed as group-led instruction    Resistance Training Performed Yes    VAD Patient? No    PAD/SET Patient? No      Pain Assessment   Currently in Pain? No/denies    Multiple Pain Sites No             Capillary Blood Glucose: No results found for this or any previous visit (from the past 24 hour(s)).    Social History   Tobacco Use  Smoking Status Former   Types: Cigarettes  Smokeless Tobacco Never  Tobacco Comments   Pt quit smoking 12-27-2022.    Goals Met:  Proper associated with RPD/PD & O2 Sat Independence with exercise equipment Exercise tolerated well No report of concerns or symptoms today Strength training completed today  Goals Unmet:  Not Applicable  Comments: Service time is from 1309 to 1430.    Dr. Mechele Collin is Medical Director for Pulmonary Rehab at [facility].

## 2023-08-16 NOTE — Telephone Encounter (Signed)
This encounter was created in error - please disregard.

## 2023-08-16 NOTE — Telephone Encounter (Signed)
Called patient.  Patient was wanting information that the person reviewed with her in pulmonary rehabilitation on 08/11/2023.  Patient also wanted info about her PFT that was done 04/21/2023, and the ONO on 07/19/2023 (no results on ONO in EPIC) and her bone density.  Informed patient we did not order the bone density and she would need to contact Dr. Barbra Sarks office.  We have PFT results, CXR.  Will send message to [facility] to get ONO results.  Patient made OV to review all results on 10/20/2023.

## 2023-09-05 ENCOUNTER — Telehealth: Payer: Self-pay | Admitting: Primary Care

## 2023-09-05 NOTE — Telephone Encounter (Signed)
Atc pt no answer, voicemail box has not been set up

## 2023-09-05 NOTE — Telephone Encounter (Signed)
PT hd a PFT, ONO and Rads and would like [place] to call her w/the results. Her # is [phone]

## 2023-09-05 NOTE — Telephone Encounter (Signed)
We reviewed PFTs during last visit that showed severe obstructive lung disease for which she is on [facility] for and we called her with results from CXR which showed no acute disease but chronic findings suggestive of COPD. I will check my in-box tomorrow for ONO as they are faxed to [place] and not apart of her electronic medical record and I am in the [place] office. Thanks

## 2023-09-05 NOTE — Telephone Encounter (Signed)
Please advise on results

## 2023-09-10 NOTE — Telephone Encounter (Signed)
Beth, please advise if you were able to find pt's ONO results and if pt has been made aware of these.

## 2023-09-12 NOTE — Telephone Encounter (Signed)
PFTs during last visit that showed severe obstructive lung disease for which she is on [facility] for and we called her with results from CXR which showed no acute disease but chronic findings suggestive of COPD.   No ONO was in my box, we need to re-send

## 2023-09-12 NOTE — Telephone Encounter (Signed)
Patient is calling back in regards to her results. She would like for someone to go over them with her. She will be in after 2:00pm today but prefers afternoons in general for a call.

## 2023-09-16 DIAGNOSIS — M85859 Other specified disorders of bone density and structure, unspecified thigh: Secondary | ICD-10-CM | POA: Diagnosis not present

## 2023-09-22 ENCOUNTER — Encounter: Payer: Self-pay | Admitting: Pulmonary Disease

## 2023-09-26 NOTE — Telephone Encounter (Signed)
88 weeks old. Pls call.

## 2023-09-29 NOTE — Telephone Encounter (Signed)
 Patient does not wish to proceed with 02 at this time. She has upcoming appt on 10/20/23 and would like to discuss in person if she really needs nocturnal 02. Re-iterated  findings of cxr and pft.

## 2023-09-30 NOTE — Telephone Encounter (Signed)
 Per telephone encounter from 12/19-   Snipes, Sonya K, [facility]     09/29/23  3:41 PM Note Patient does not wish to proceed with 02 at this time. She has upcoming appt on 10/20/23 and would like to discuss in person if she really needs nocturnal 02. Re-iterated  findings of cxr and pft.       I will close this encounter.

## 2023-10-05 ENCOUNTER — Encounter: Payer: Self-pay | Admitting: Pulmonary Disease

## 2023-10-20 ENCOUNTER — Ambulatory Visit (INDEPENDENT_AMBULATORY_CARE_PROVIDER_SITE_OTHER): Payer: Medicare HMO | Admitting: Pulmonary Disease

## 2023-10-20 ENCOUNTER — Encounter: Payer: Self-pay | Admitting: Pulmonary Disease

## 2023-10-20 VITALS — BP 106/74 | HR 90 | Temp 98.0°F | Ht 59.5 in | Wt 130.4 lb

## 2023-10-20 DIAGNOSIS — J432 Centrilobular emphysema: Secondary | ICD-10-CM

## 2023-10-20 NOTE — Patient Instructions (Addendum)
We will schedule you for overnight oxygen test  Continue Anoro ellipta 1 puff daily  Use albuterol inhaler 1-2 puffs every 4-6 hours as needed  Follow up in 6 months, in person or via video visit

## 2023-10-20 NOTE — Progress Notes (Signed)
Synopsis: Referred in May 2024 for emphysema by Lorenda Ishihara, MD  Subjective:   PATIENT ID: Sarah Harrell GENDER: female DOB: 1941/03/04, MRN: [id number]  HPI  Chief Complaint  Patient presents with   Follow-up   Sarah Harrell is an 83 year old woman, daily smoker who returns to pulmonary clinic for emphysema.   She had ONO done in 07/2023 which showed greater than 5hrs of SPO2 less than 88%, initially reported incorrectly as 5 minutes to the patient previously. Revewied ONO results in detail.   She continues on Anoro daily and as needed albuterol. Has rare use for albuterol. She is working out regularly. She has completed pulmonary rehab.   OV 04/21/23 She feels more short of breath since last visit. She stopped smoking since last visit. She has had 4 cigarettes since last visit. She used nicotine gum and lozenges. She is rarely using albuterol inhaler.   PFTs today show moderately severe obstruction, significant bronchodilator response, air trapping and mild diffusion defect.   Initial OV 02/01/23 She is currently have cold like symptoms that started 5 days ago. She is feeling better. She is able to complete activities of daily living and go grocery shopping without issue. No cough or mucous production or wheezing. She has albuterol inhaler which she is using 3-4 times per day over past 5 days, other wise she uses it a couple times per week. She is currently coughing up pale yellow sputum.   She has smoked for 60 years, she smoked 1 pack per day a majority of the time and 2 packs per day for 3-4 years. Currently smoking 7 cigarettes per day. She has tried to quit previously and is using nicotine gum to help quit. She was a Tourist information centre manager.   Past Medical History:  Diagnosis Date   Depressive disorder, not elsewhere classified    Elevated fasting glucose    Glucose 110 as of 09/13/13   Fatigue    Guaiac positive stools    Normal colonoscopy 2009   Insomnia, unspecified     Joint pain    Mixed hyperlipidemia    Systemic sclerosis (HCC)    Tobacco dependence    Vitamin D deficiency      Family History  Problem Relation Age of Onset   CAD Mother 67   Hypertension Father    Cancer Brother        pancreatic   Breast cancer Neg Hx      Social History   Socioeconomic History   Marital status: Divorced    Spouse name: Not on file   Number of children: Not on file   Years of education: Not on file   Highest education level: Master's degree (e.g., MA, MS, MEng, MEd, MSW, MBA)  Occupational History   Not on file  Tobacco Use   Smoking status: Former    Types: Cigarettes   Smokeless tobacco: Never   Tobacco comments:    Pt quit smoking 12-27-2022.  Substance and Sexual Activity   Alcohol use: Yes    Comment: daily   Drug use: No   Sexual activity: Not Currently  Other Topics Concern   Not on file  Social History Narrative   Not on file   Social Drivers of Health   Financial Resource Strain: Not on file  Food Insecurity: Not on file  Transportation Needs: Not on file  Physical Activity: Not on file  Stress: Not on file  Social Connections: Not on file  Intimate Partner  Violence: Not on file     Allergies  Allergen Reactions   Caffeine Hives     Outpatient Medications Prior to Visit  Medication Sig Dispense Refill   albuterol (VENTOLIN HFA) 108 (90 Base) MCG/ACT inhaler Inhale 1-2 puffs into the lungs every 6 (six) hours as needed.     amLODipine (NORVASC) 5 MG tablet Take 5 mg by mouth daily.     citalopram (CELEXA) 20 MG tablet Take 20 mg by mouth daily.     ECHINACEA PO      Multiple Vitamin (MULTIVITAMIN) tablet Take 1 tablet by mouth daily.     rosuvastatin (CRESTOR) 10 MG tablet Take 10 mg by mouth daily.     traZODone (DESYREL) 100 MG tablet Take 50 mg by mouth at bedtime.     umeclidinium-vilanterol (ANORO ELLIPTA) 62.5-25 MCG/ACT AEPB Inhale 1 puff into the lungs daily. 60 each 5   No facility-administered medications  prior to visit.    Review of Systems  Constitutional:  Negative for chills, fever, malaise/fatigue and weight loss.  HENT:  Negative for congestion, sinus pain and sore throat.   Eyes: Negative.   Respiratory:  Positive for shortness of breath. Negative for cough, hemoptysis, sputum production and wheezing.   Cardiovascular:  Negative for chest pain, palpitations, orthopnea, claudication and leg swelling.  Gastrointestinal:  Negative for abdominal pain, heartburn, nausea and vomiting.  Genitourinary: Negative.   Musculoskeletal:  Negative for joint pain and myalgias.  Skin:  Negative for rash.  Neurological:  Negative for weakness.  Endo/Heme/Allergies: Negative.   Psychiatric/Behavioral: Negative.      Objective:   Vitals:   10/20/23 1459  BP: 106/74  Pulse: 90  Temp: 98 F (36.7 C)  TempSrc: Temporal  SpO2: 94%  Weight: 130 lb 6.4 oz (59.1 kg)  Height: 4' 11.5" (1.511 m)     Physical Exam Constitutional:      General: She is not in acute distress.    Appearance: She is not ill-appearing.  HENT:     Head: Normocephalic and atraumatic.  Eyes:     General: No scleral icterus. Cardiovascular:     Rate and Rhythm: Normal rate and regular rhythm.     Pulses: Normal pulses.     Heart sounds: Normal heart sounds. No murmur heard. Pulmonary:     Effort: Pulmonary effort is normal.     Breath sounds: Decreased air movement present. No wheezing, rhonchi or rales.  Musculoskeletal:     Right lower leg: No edema.     Left lower leg: No edema.  Skin:    General: Skin is warm and dry.  Neurological:     Mental Status: She is alert.    CBC No results found for: "WBC", "RBC", "HGB", "HCT", "PLT", "MCV", "MCH", "MCHC", "RDW", "LYMPHSABS", "MONOABS", "EOSABS", "BASOSABS"   Chest imaging: CT Chest 01/12/21 Cardiovascular: Atherosclerosis of thoracic aorta is noted without aneurysm formation. Normal cardiac size. No pericardial effusion.   Mediastinum/Nodes: Small  sliding-type hiatal hernia is noted. No adenopathy is noted. Thyroid gland is unremarkable.   Lungs/Pleura: No pneumothorax or pleural effusion is noted. Emphysematous disease is noted bilaterally. Mild biapical scarring is noted. Right upper lobe pulmonary nodule noted on prior exam is not visualized currently. Scarring remains in the right upper lobe.  PFT:    Latest Ref Rng & Units 04/21/2023   10:57 AM  PFT Results  FVC-Pre L 1.26   FVC-Predicted Pre % 58   FVC-Post L 1.60   FVC-Predicted Post %  74   Pre FEV1/FVC % % 54   Post FEV1/FCV % % 48   FEV1-Pre L 0.68   FEV1-Predicted Pre % 43   FEV1-Post L 0.76   DLCO uncorrected ml/min/mmHg 9.70   DLCO UNC% % 59   DLCO corrected ml/min/mmHg 9.70   DLCO COR %Predicted % 59   DLVA Predicted % 76   TLC L 5.09   TLC % Predicted % 114   RV % Predicted % 173     Labs:  Path:  Echo:  Heart Catheterization:    Assessment & Plan:   No diagnosis found.  Discussion: Sarah Harrell is an 83 year old woman, daily smoker who returns to pulmonary clinic for emphysema.  Chronic Obstructive Pulmonary Disease (COPD) Patient with history of emphysema, moderately severe obstruction, currently on Anoro.  -Continue Anoro inhaler. -Advise patient to monitor oxygen levels during exercise and report if consistently dropping below 88%.  Nocturnal Hypoxemia Overnight oxygen test showed oxygen saturation below 88% for over 5 hours. Discussed the benefits of oxygen therapy for COPD and potential effects of low oxygen levels on cognition and heart health. -Order repeat overnight oxygen test as last study is outdated to order oxygen  Nasal Congestion Discussed potential impact on oxygen levels. -Advise use of Breathe Right strips and Flonase nasal spray during periods of congestion.  Follow-up in 6 months or sooner if needed  Sarah Comas, MD [facility] Office: [phone]    Current Outpatient Medications:     albuterol (VENTOLIN HFA) 108 (90 Base) MCG/ACT inhaler, Inhale 1-2 puffs into the lungs every 6 (six) hours as needed., Disp: , Rfl:    amLODipine (NORVASC) 5 MG tablet, Take 5 mg by mouth daily., Disp: , Rfl:    citalopram (CELEXA) 20 MG tablet, Take 20 mg by mouth daily., Disp: , Rfl:    ECHINACEA PO, , Disp: , Rfl:    Multiple Vitamin (MULTIVITAMIN) tablet, Take 1 tablet by mouth daily., Disp: , Rfl:    rosuvastatin (CRESTOR) 10 MG tablet, Take 10 mg by mouth daily., Disp: , Rfl:    traZODone (DESYREL) 100 MG tablet, Take 50 mg by mouth at bedtime., Disp: , Rfl:    umeclidinium-vilanterol (ANORO ELLIPTA) 62.5-25 MCG/ACT AEPB, Inhale 1 puff into the lungs daily., Disp: 60 each, Rfl: 5

## 2023-10-22 ENCOUNTER — Encounter: Payer: Self-pay | Admitting: Pulmonary Disease

## 2023-10-27 DIAGNOSIS — J432 Centrilobular emphysema: Secondary | ICD-10-CM | POA: Diagnosis not present

## 2023-10-29 ENCOUNTER — Other Ambulatory Visit: Payer: Self-pay | Admitting: Pulmonary Disease

## 2023-10-29 DIAGNOSIS — J432 Centrilobular emphysema: Secondary | ICD-10-CM

## 2023-11-02 ENCOUNTER — Encounter: Payer: Self-pay | Admitting: Pulmonary Disease

## 2023-11-02 DIAGNOSIS — G4734 Idiopathic sleep related nonobstructive alveolar hypoventilation: Secondary | ICD-10-CM

## 2023-11-02 NOTE — Telephone Encounter (Signed)
 Unable to leave message. VM either full or unavailable. Will try again later.

## 2023-11-03 ENCOUNTER — Telehealth: Payer: Self-pay | Admitting: Pulmonary Disease

## 2023-11-03 DIAGNOSIS — J432 Centrilobular emphysema: Secondary | ICD-10-CM

## 2023-11-03 MED ORDER — ANORO ELLIPTA 62.5-25 MCG/ACT IN AEPB: 1.0000 | INHALATION_SPRAY | Freq: Every day | RESPIRATORY_TRACT | 5 refills | Status: DC

## 2023-11-03 NOTE — Telephone Encounter (Signed)
 Patient needs Anoro inhaler refilled.  Pharmacy: [facility] [address]

## 2023-11-03 NOTE — Telephone Encounter (Signed)
 Notified patient of results and have placed order for nocturnal oxygen 

## 2023-11-03 NOTE — Telephone Encounter (Signed)
 Pt states having a problem getting a rx refilled feels there may be a miscommunication along the way please reach back out

## 2023-11-03 NOTE — Telephone Encounter (Signed)
 Anoro has been sent to preferred pharmacy.  Pt is aware and voiced her understanding.  Nothing further needed.

## 2023-11-03 NOTE — Telephone Encounter (Addendum)
 [facility].  [facility] to verify that [facility] was received.   Spoke to patient and updated her.

## 2023-11-04 DIAGNOSIS — G4736 Sleep related hypoventilation in conditions classified elsewhere: Secondary | ICD-10-CM | POA: Diagnosis not present

## 2023-11-04 DIAGNOSIS — J449 Chronic obstructive pulmonary disease, unspecified: Secondary | ICD-10-CM | POA: Diagnosis not present

## 2023-11-21 ENCOUNTER — Encounter: Payer: Self-pay | Admitting: Pulmonary Disease

## 2023-12-02 DIAGNOSIS — G4736 Sleep related hypoventilation in conditions classified elsewhere: Secondary | ICD-10-CM | POA: Diagnosis not present

## 2023-12-02 DIAGNOSIS — J449 Chronic obstructive pulmonary disease, unspecified: Secondary | ICD-10-CM | POA: Diagnosis not present

## 2023-12-28 DIAGNOSIS — L738 Other specified follicular disorders: Secondary | ICD-10-CM | POA: Diagnosis not present

## 2023-12-28 DIAGNOSIS — D225 Melanocytic nevi of trunk: Secondary | ICD-10-CM | POA: Diagnosis not present

## 2023-12-28 DIAGNOSIS — D485 Neoplasm of uncertain behavior of skin: Secondary | ICD-10-CM | POA: Diagnosis not present

## 2023-12-28 DIAGNOSIS — L720 Epidermal cyst: Secondary | ICD-10-CM | POA: Diagnosis not present

## 2023-12-28 DIAGNOSIS — L821 Other seborrheic keratosis: Secondary | ICD-10-CM | POA: Diagnosis not present

## 2023-12-28 DIAGNOSIS — C44622 Squamous cell carcinoma of skin of right upper limb, including shoulder: Secondary | ICD-10-CM | POA: Diagnosis not present

## 2023-12-28 DIAGNOSIS — L814 Other melanin hyperpigmentation: Secondary | ICD-10-CM | POA: Diagnosis not present

## 2024-01-02 DIAGNOSIS — J449 Chronic obstructive pulmonary disease, unspecified: Secondary | ICD-10-CM | POA: Diagnosis not present

## 2024-01-02 DIAGNOSIS — G4736 Sleep related hypoventilation in conditions classified elsewhere: Secondary | ICD-10-CM | POA: Diagnosis not present

## 2024-01-16 DIAGNOSIS — E785 Hyperlipidemia, unspecified: Secondary | ICD-10-CM | POA: Diagnosis not present

## 2024-01-16 DIAGNOSIS — Z0000 Encounter for general adult medical examination without abnormal findings: Secondary | ICD-10-CM | POA: Diagnosis not present

## 2024-01-16 DIAGNOSIS — Z1331 Encounter for screening for depression: Secondary | ICD-10-CM | POA: Diagnosis not present

## 2024-02-01 DIAGNOSIS — G4736 Sleep related hypoventilation in conditions classified elsewhere: Secondary | ICD-10-CM | POA: Diagnosis not present

## 2024-02-01 DIAGNOSIS — J449 Chronic obstructive pulmonary disease, unspecified: Secondary | ICD-10-CM | POA: Diagnosis not present

## 2024-02-07 DIAGNOSIS — C44622 Squamous cell carcinoma of skin of right upper limb, including shoulder: Secondary | ICD-10-CM | POA: Diagnosis not present

## 2024-02-09 DIAGNOSIS — H01111 Allergic dermatitis of right upper eyelid: Secondary | ICD-10-CM | POA: Diagnosis not present

## 2024-02-16 DIAGNOSIS — L237 Allergic contact dermatitis due to plants, except food: Secondary | ICD-10-CM | POA: Diagnosis not present

## 2024-03-03 DIAGNOSIS — G4736 Sleep related hypoventilation in conditions classified elsewhere: Secondary | ICD-10-CM | POA: Diagnosis not present

## 2024-03-03 DIAGNOSIS — J449 Chronic obstructive pulmonary disease, unspecified: Secondary | ICD-10-CM | POA: Diagnosis not present

## 2024-03-12 DIAGNOSIS — L905 Scar conditions and fibrosis of skin: Secondary | ICD-10-CM | POA: Diagnosis not present

## 2024-03-15 ENCOUNTER — Other Ambulatory Visit: Payer: Self-pay | Admitting: Internal Medicine

## 2024-03-15 DIAGNOSIS — Z1231 Encounter for screening mammogram for malignant neoplasm of breast: Secondary | ICD-10-CM

## 2024-03-27 ENCOUNTER — Ambulatory Visit
Admission: RE | Admit: 2024-03-27 | Discharge: 2024-03-27 | Disposition: A | Discharge status: Home / Self Care | Source: Ambulatory Visit | Attending: Internal Medicine | Admitting: Internal Medicine

## 2024-03-27 DIAGNOSIS — Z1231 Encounter for screening mammogram for malignant neoplasm of breast: Secondary | ICD-10-CM | POA: Diagnosis not present

## 2024-04-02 DIAGNOSIS — J449 Chronic obstructive pulmonary disease, unspecified: Secondary | ICD-10-CM | POA: Diagnosis not present

## 2024-04-02 DIAGNOSIS — G4736 Sleep related hypoventilation in conditions classified elsewhere: Secondary | ICD-10-CM | POA: Diagnosis not present

## 2024-04-23 ENCOUNTER — Encounter: Payer: Self-pay | Admitting: Pulmonary Disease

## 2024-04-23 ENCOUNTER — Ambulatory Visit: Admitting: Pulmonary Disease

## 2024-04-23 VITALS — BP 125/65 | HR 95 | Ht 59.5 in | Wt 131.2 lb

## 2024-04-23 DIAGNOSIS — J432 Centrilobular emphysema: Secondary | ICD-10-CM | POA: Diagnosis not present

## 2024-04-23 DIAGNOSIS — G4734 Idiopathic sleep related nonobstructive alveolar hypoventilation: Secondary | ICD-10-CM | POA: Diagnosis not present

## 2024-04-23 MED ORDER — UMECLIDINIUM-VILANTEROL 62.5-25 MCG/ACT IN AEPB: 1.0000 | INHALATION_SPRAY | Freq: Every day | RESPIRATORY_TRACT | 5 refills | Status: DC

## 2024-04-23 NOTE — Progress Notes (Unsigned)
 Synopsis: Referred in May 2024 for emphysema by Valery Ripple, MD  Subjective:   PATIENT ID: Sarah Harrell, Sarah Harrell  HPI  Chief Complaint  Patient presents with   Follow-up    64mo emphysema f/u Pt has no concerns.   Sarah Harrell is an 83 year old woman, former smoker who returns to pulmonary clinic for emphysema and nocturnal hypoxemia.   She uses oxygen  at night without issues and is currently on Anoro, with infrequent use of albuterol. She exercises regularly, performing cardio sessions three times a week without drops in her oxygen  on personal monitoring. She has increased her exertion level. She experiences shortness of breath when working outside, likely due to the heat. She quit smoking over a year ago and now uses nicotine gum.    Past Medical History:  Diagnosis Date   Depressive disorder, not elsewhere classified    Elevated fasting glucose    Glucose 110 as of 09/13/13   Fatigue    Guaiac positive stools    Normal colonoscopy 2009   Insomnia, unspecified    Joint pain    Mixed hyperlipidemia    Systemic sclerosis (HCC)    Tobacco dependence    Vitamin D deficiency      Family History  Problem Relation Age of Onset   CAD Mother 29   Hypertension Father    Cancer Brother        pancreatic   Breast cancer Neg Hx      Social History   Socioeconomic History   Marital status: Divorced    Spouse name: Not on file   Number of children: Not on file   Years of education: Not on file   Highest education level: Master's degree (e.g., MA, MS, MEng, MEd, MSW, MBA)  Occupational History   Not on file  Tobacco Use   Smoking status: Former    Types: Cigarettes   Smokeless tobacco: Never   Tobacco comments:    Pt quit smoking 12-27-2022.  Substance and Sexual Activity   Alcohol use: Yes    Comment: daily   Drug use: No   Sexual activity: Not Currently  Other Topics Concern   Not on file  Social History Narrative    Not on file   Social Drivers of Health   Financial Resource Strain: Not on file  Food Insecurity: Not on file  Transportation Needs: Not on file  Physical Activity: Not on file  Stress: Not on file  Social Connections: Not on file  Intimate Partner Violence: Not on file     Allergies  Allergen Reactions   Caffeine Hives     Outpatient Medications Prior to Visit  Medication Sig Dispense Refill   albuterol (VENTOLIN HFA) 108 (90 Base) MCG/ACT inhaler Inhale 1-2 puffs into the lungs every 6 (six) hours as needed.     amLODipine (NORVASC) 5 MG tablet Take 5 mg by mouth daily.     citalopram (CELEXA) 20 MG tablet Take 20 mg by mouth daily.     ECHINACEA PO      Multiple Vitamin (MULTIVITAMIN) tablet Take 1 tablet by mouth daily.     rosuvastatin (CRESTOR) 10 MG tablet Take 10 mg by mouth daily.     traZODone (DESYREL) 100 MG tablet Take 50 mg by mouth at bedtime.     umeclidinium-vilanterol (ANORO ELLIPTA ) 62.5-25 MCG/ACT AEPB Inhale 1 puff into the lungs daily. 60 each 5   No facility-administered medications prior to visit.  Review of Systems  Constitutional:  Negative for chills, fever, malaise/fatigue and weight loss.  HENT:  Negative for congestion, sinus pain and sore throat.   Eyes: Negative.   Respiratory:  Positive for shortness of breath. Negative for cough, hemoptysis, sputum production and wheezing.   Cardiovascular:  Negative for chest pain, palpitations, orthopnea, claudication and leg swelling.  Gastrointestinal:  Negative for abdominal pain, heartburn, nausea and vomiting.  Genitourinary: Negative.   Musculoskeletal:  Negative for joint pain and myalgias.  Skin:  Negative for rash.  Neurological:  Negative for weakness.  Endo/Heme/Allergies: Negative.   Psychiatric/Behavioral: Negative.      Objective:   Vitals:   04/23/24 1422 04/23/24 1424  BP:  125/65  Pulse:  95  SpO2:  94%  Weight: 131 lb 3.2 oz (59.5 kg) 131 lb 3.2 oz (59.5 kg)  Height:  4'  11.5 (1.511 m)     Physical Exam Constitutional:      General: She is not in acute distress.    Appearance: She is not ill-appearing.  HENT:     Head: Normocephalic and atraumatic.  Eyes:     General: No scleral icterus. Cardiovascular:     Rate and Rhythm: Normal rate and regular rhythm.     Pulses: Normal pulses.     Heart sounds: Normal heart sounds. No murmur heard. Pulmonary:     Effort: Pulmonary effort is normal.     Breath sounds: No wheezing, rhonchi or rales.  Musculoskeletal:     Right lower leg: No edema.     Left lower leg: No edema.  Skin:    General: Skin is warm and dry.  Neurological:     Mental Status: She is alert.    CBC No results found for: WBC, RBC, HGB, HCT, PLT, MCV, MCH, MCHC, RDW, LYMPHSABS, MONOABS, EOSABS, BASOSABS  Chest imaging: CT Chest 01/12/21 Cardiovascular: Atherosclerosis of thoracic aorta is noted without aneurysm formation. Normal cardiac size. No pericardial effusion.   Mediastinum/Nodes: Small sliding-type hiatal hernia is noted. No adenopathy is noted. Thyroid gland is unremarkable.   Lungs/Pleura: No pneumothorax or pleural effusion is noted. Emphysematous disease is noted bilaterally. Mild biapical scarring is noted. Right upper lobe pulmonary nodule noted on prior exam is not visualized currently. Scarring remains in the right upper lobe.  PFT:    Latest Ref Rng & Units 04/21/2023   10:57 AM  PFT Results  FVC-Pre L 1.26   FVC-Predicted Pre % 58   FVC-Post L 1.60   FVC-Predicted Post % 74   Pre FEV1/FVC % % 54   Post FEV1/FCV % % 48   FEV1-Pre L 0.68   FEV1-Predicted Pre % 43   FEV1-Post L 0.76   DLCO uncorrected ml/min/mmHg 9.70   DLCO UNC% % 59   DLCO corrected ml/min/mmHg 9.70   DLCO COR %Predicted % 59   DLVA Predicted % 76   TLC L 5.09   TLC % Predicted % 114   RV % Predicted % 173     Labs:  Path:  Echo:  Heart Catheterization:    Assessment & Plan:   Centrilobular  emphysema (HCC)  Nocturnal hypoxia  Discussion: Sarah Harrell is an 83 year old woman, daily smoker who returns to pulmonary clinic for emphysema.  Chronic Obstructive Pulmonary Disease (COPD) -Continue Anoro inhaler. - Use albuterol inhaler 1-2 puffs every 4-6 hours as needed  Nocturnal Hypoxemia - continue 2L of oxygen  at night  Follow-up in 6 months or sooner if needed  Dorn  Kara, MD [facility] Office: [phone]    Current Outpatient Medications:    albuterol (VENTOLIN HFA) 108 (90 Base) MCG/ACT inhaler, Inhale 1-2 puffs into the lungs every 6 (six) hours as needed., Disp: , Rfl:    amLODipine (NORVASC) 5 MG tablet, Take 5 mg by mouth daily., Disp: , Rfl:    citalopram (CELEXA) 20 MG tablet, Take 20 mg by mouth daily., Disp: , Rfl:    ECHINACEA PO, , Disp: , Rfl:    Multiple Vitamin (MULTIVITAMIN) tablet, Take 1 tablet by mouth daily., Disp: , Rfl:    rosuvastatin (CRESTOR) 10 MG tablet, Take 10 mg by mouth daily., Disp: , Rfl:    traZODone (DESYREL) 100 MG tablet, Take 50 mg by mouth at bedtime., Disp: , Rfl:    umeclidinium-vilanterol (ANORO ELLIPTA ) 62.5-25 MCG/ACT AEPB, Inhale 1 puff into the lungs daily., Disp: 60 each, Rfl: 5

## 2024-04-23 NOTE — Patient Instructions (Addendum)
 Continue anoro ellipta  1 puff daily  Use albuterol inhaler 1-2 puffs every 4-6 hours as needed  Follow up in 6 months, call sooner if any changes in your breathing

## 2024-04-24 ENCOUNTER — Encounter: Payer: Self-pay | Admitting: Pulmonary Disease

## 2024-05-03 DIAGNOSIS — J449 Chronic obstructive pulmonary disease, unspecified: Secondary | ICD-10-CM | POA: Diagnosis not present

## 2024-05-03 DIAGNOSIS — G4736 Sleep related hypoventilation in conditions classified elsewhere: Secondary | ICD-10-CM | POA: Diagnosis not present

## 2024-06-03 DIAGNOSIS — J449 Chronic obstructive pulmonary disease, unspecified: Secondary | ICD-10-CM | POA: Diagnosis not present

## 2024-06-03 DIAGNOSIS — G4736 Sleep related hypoventilation in conditions classified elsewhere: Secondary | ICD-10-CM | POA: Diagnosis not present

## 2024-06-27 DIAGNOSIS — H5211 Myopia, right eye: Secondary | ICD-10-CM | POA: Diagnosis not present

## 2024-06-27 DIAGNOSIS — H524 Presbyopia: Secondary | ICD-10-CM | POA: Diagnosis not present

## 2024-07-03 DIAGNOSIS — J449 Chronic obstructive pulmonary disease, unspecified: Secondary | ICD-10-CM | POA: Diagnosis not present

## 2024-07-03 DIAGNOSIS — G4736 Sleep related hypoventilation in conditions classified elsewhere: Secondary | ICD-10-CM | POA: Diagnosis not present

## 2024-07-17 DIAGNOSIS — E785 Hyperlipidemia, unspecified: Secondary | ICD-10-CM | POA: Diagnosis not present

## 2024-08-02 ENCOUNTER — Ambulatory Visit (HOSPITAL_COMMUNITY)
Admission: EM | Admit: 2024-08-02 | Discharge: 2024-08-02 | Disposition: A | Discharge status: Home / Self Care | Attending: Family Medicine | Admitting: Family Medicine

## 2024-08-02 ENCOUNTER — Ambulatory Visit (INDEPENDENT_AMBULATORY_CARE_PROVIDER_SITE_OTHER)

## 2024-08-02 ENCOUNTER — Encounter (HOSPITAL_COMMUNITY): Payer: Self-pay

## 2024-08-02 ENCOUNTER — Ambulatory Visit: Payer: Self-pay | Admitting: Pulmonary Disease

## 2024-08-02 DIAGNOSIS — J439 Emphysema, unspecified: Secondary | ICD-10-CM | POA: Diagnosis not present

## 2024-08-02 DIAGNOSIS — R0602 Shortness of breath: Secondary | ICD-10-CM | POA: Diagnosis not present

## 2024-08-02 DIAGNOSIS — I700 Atherosclerosis of aorta: Secondary | ICD-10-CM | POA: Diagnosis not present

## 2024-08-02 DIAGNOSIS — J441 Chronic obstructive pulmonary disease with (acute) exacerbation: Secondary | ICD-10-CM

## 2024-08-02 DIAGNOSIS — J929 Pleural plaque without asbestos: Secondary | ICD-10-CM | POA: Diagnosis not present

## 2024-08-02 MED ORDER — DEXAMETHASONE SOD PHOSPHATE PF 10 MG/ML IJ SOLN
10.0000 mg | Freq: Once | INTRAMUSCULAR | Status: AC
  Administered 2024-08-02: 10 mg via INTRAMUSCULAR

## 2024-08-02 MED ORDER — IPRATROPIUM-ALBUTEROL 0.5-2.5 (3) MG/3ML IN SOLN
3.0000 mL | Freq: Once | RESPIRATORY_TRACT | Status: AC
  Administered 2024-08-02: 3 mL via RESPIRATORY_TRACT

## 2024-08-02 MED ORDER — AZITHROMYCIN 250 MG PO TABS: 250.0000 mg | ORAL_TABLET | Freq: Every day | ORAL | 0 refills | Status: DC

## 2024-08-02 MED ORDER — IPRATROPIUM-ALBUTEROL 0.5-2.5 (3) MG/3ML IN SOLN
RESPIRATORY_TRACT | Status: AC
  Filled 2024-08-02: qty 3

## 2024-08-02 NOTE — Telephone Encounter (Signed)
 Offered patient an appointment at [facility] in [place] today but she states that location was inconvenient Patient states she will go to Urgent Care and this RN assisted her with locating the closest one to her with directions and also advised her if anything got worse to go to the Emergency Room. Patient verbalized understanding.  FYI Only or Action Required?: FYI only for provider: Patient going to [facility] to be seen today.  Patient is followed in Pulmonology for centrilobular emphysema and nocturnal hypoxia, last seen on 04/23/2024 by Kara Dorn NOVAK, MD.  Called Nurse Triage reporting Shortness of Breath.  Symptoms began 2 weeks ago.  Interventions attempted: Rescue inhaler, Maintenance inhaler, Home oxygen  use, and Increased fluids/rest.  Symptoms are: gradually worsening.  Triage Disposition: See HCP Within 4 Hours (Or PCP Triage)  Patient/caregiver understands and will follow disposition?: Yes--patient states she will go to [facility] on [facility] today                Copied from [employer]SPAN [id number]. Topic: Clinical - Red Word Triage >> Aug 02, 2024  1:20 PM Sarah Harrell wrote: [facility] that prompted transfer to Nurse Triage: Shortness of breath Reason for Disposition . [1] Longstanding difficulty breathing (e.g., CHF, COPD, emphysema) AND [2] WORSE than normal  Answer Assessment - Initial Assessment Questions Patient states increased shortness of breath over the past two weeks She gets short of breath easier on exertion  Offered patient an appointment at [facility] in [place] today but she states that location was inconvenient Patient states she will go to Urgent Care and this RN assisted her with locating the closest one to her with directions and also advised her to call us  back with any further questions or concerns and if anything got worse to go to the Emergency Room. Patient verbalized  understanding.      Sarah Harrell Pulmonary Triage - Initial Assessment Questions Chief Complaint (e.g., cough, sob, wheezing, fever, chills, sweat or additional symptoms) *Go to specific symptom protocol after initial questions. Shortness of breath on exertion  How long have symptoms been present? 2 weeks  Have you tested for COVID or Flu? Note: If not, ask patient if a home test can be taken. If so, instruct patient to call back for positive results. No  MEDICINES:   Have you used any OTC meds to help with symptoms? Yes If yes, ask What medications? Anti-histamine  Have you used your inhalers/maintenance medication? Yes If yes, What medications? Albuterol Inhaler-- Inhale 1-2 puffs into the lungs every 6 (six) hours as needed. Anora Ellipta--Inhale 1 puff into the lungs daily.   OXYGEN : Do you wear supplemental oxygen ? Yes If yes, How many liters are you supposed to use? At night  Do you monitor your oxygen  levels? Yes If yes, What is your reading (oxygen  level) today? Fluctuating high 80s and low 90s During Triage  93% and 92 pulse   What is your usual oxygen  saturation reading?  (Note: Pulmonary O2 sats should be 90% or greater) 94%     3. PATTERN Does the difficult breathing come and go, or has it been constant since it started?      Comes and goes  worse with exertion 4. SEVERITY: How bad is your breathing? (e.g., mild, moderate, severe)      Patient states she feels fine but normal activities are affecting her worse than they used to--hasn't been able to do as much cardio without getting  out of breath easier 5. RECURRENT SYMPTOM: Have you had difficulty breathing before? If Yes, ask: When was the last time? and What happened that time?      ------- 6. CARDIAC HISTORY: Do you have any history of heart disease? (e.g., heart attack, angina, bypass surgery, angioplasty)      denies 7. LUNG HISTORY: Do you have any history of lung  disease?  (e.g., pulmonary embolus, asthma, emphysema)     COPD 8. CAUSE: What do you think is causing the breathing problem?      unsure 9. OTHER SYMPTOMS: Do you have any other symptoms? (e.g., chest pain, cough, dizziness, fever, runny nose)     denies  Protocols used: Breathing Difficulty-A-AH

## 2024-08-02 NOTE — ED Provider Notes (Addendum)
 [facility]   [id number] 08/02/24 Arrival Time: 1418  ASSESSMENT & PLAN:w  1. SOB (shortness of breath)   2. COPD exacerbation (HCC)    Without resp distress. Initial SpO2 around 90-91%. After duoneb SpO2 98% on RA.  Meds ordered this encounter  Medications   ipratropium-albuterol (DUONEB) 0.5-2.5 (3) MG/3ML nebulizer solution 3 mL   dexamethasone (DECADRON) injection 10 mg   I have personally viewed and independently interpreted the imaging studies ordered this visit. CXR: no acute changes; no PNA.  ECG interpreted by me: NSR. No acute changes. Recommend that she call her pulmonologist to schedule prompt follow up.  Reviewed expectations re: course of current medical issues. Questions answered. Outlined signs and symptoms indicating need for more acute intervention. Patient verbalized understanding. After Visit Summary given.  SUBJECTIVE: History from: patient.  Sarah Harrell is a 83 y.o. female with emphysema who presents with complaint of feeling SOB; past 2 weeks; slightly productive cough. Denies CP. Patient has been checking her O2 at home and has noticed that her oxygen  has been low. Patient states that she was advised by her pulmonologist to come here.   Social History   Tobacco Use  Smoking Status Former   Types: Cigarettes  Smokeless Tobacco Never  Tobacco Comments   Pt quit smoking 12-27-2022.    OBJECTIVE:  Vitals:   08/02/24 1632  BP: 137/84  Pulse: 76  Resp: 16  Temp: 98 F (36.7 C)  TempSrc: Oral  SpO2: 90%    General appearance: alert; NAD HEENT: [place]; AT; without nasal congestion Neck: supple without LAD CV: RRR Lungs: unlabored respirations, mild to moderate  bilateral expiratory wheezing; cough: mild; no significant respiratory distress Skin: warm and dry Psychological: alert and cooperative; normal mood and affect  Imaging: DG Chest 2 View Result Date: 08/02/2024 CLINICAL DATA:  SOB x 2 weeks EXAM: CHEST - 2 VIEW  COMPARISON:  07/26/2023 FINDINGS: Biapical pleural thickening. Chronic coarsening of the pulmonary interstitium, likely due to underlying emphysema. No focal airspace consolidation, pleural effusion, or pneumothorax. No cardiomegaly. Aortic atherosclerosis. No acute fracture or destructive lesions. Multilevel thoracic osteophytosis. Diffuse osteopenia. IMPRESSION: Emphysema. No pneumonia or pulmonary edema. Electronically Signed   By: Rogelia Myers M.D.   On: 08/02/2024 17:06    Allergies  Allergen Reactions   Caffeine Hives    Past Medical History:  Diagnosis Date   Depressive disorder, not elsewhere classified    Elevated fasting glucose    Glucose 110 as of 09/13/13   Fatigue    Guaiac positive stools    Normal colonoscopy 2009   Insomnia, unspecified    Joint pain    Mixed hyperlipidemia    Systemic sclerosis (HCC)    Tobacco dependence    Vitamin D deficiency    Family History  Problem Relation Age of Onset   CAD Mother 83   Hypertension Father    Cancer Brother        pancreatic   Breast cancer Neg Hx    Social History   Socioeconomic History   Marital status: Divorced    Spouse name: Not on file   Number of children: Not on file   Years of education: Not on file   Highest education level: Master's degree (e.g., MA, MS, MEng, MEd, MSW, MBA)  Occupational History   Not on file  Tobacco Use   Smoking status: Former    Types: Cigarettes   Smokeless tobacco: Never   Tobacco comments:    Pt quit  smoking 12-27-2022.  Substance and Sexual Activity   Alcohol use: Yes    Comment: daily   Drug use: No   Sexual activity: Not Currently  Other Topics Concern   Not on file  Social History Narrative   Not on file   Social Drivers of Health   Financial Resource Strain: Not on file  Food Insecurity: Not on file  Transportation Needs: Not on file  Physical Activity: Not on file  Stress: Not on file  Social Connections: Not on file  Intimate Partner Violence: Not  on file             Rolinda Rogue, MD 08/02/24 [place]NET[place]    Rolinda Rogue, MD 08/02/24 [id number]

## 2024-08-02 NOTE — ED Triage Notes (Signed)
 Patient here today with c/o SOB, productive cough, and chest congestion X 2 weeks. Patient has been checking her O2 at home and has noticed that her oxygen  has been real low. Patient states that she was advised by her pulmonologist to come here.

## 2024-08-02 NOTE — Telephone Encounter (Signed)
 Noted. NFN

## 2024-08-03 DIAGNOSIS — J449 Chronic obstructive pulmonary disease, unspecified: Secondary | ICD-10-CM | POA: Diagnosis not present

## 2024-08-03 DIAGNOSIS — G4736 Sleep related hypoventilation in conditions classified elsewhere: Secondary | ICD-10-CM | POA: Diagnosis not present

## 2024-08-24 ENCOUNTER — Telehealth: Payer: Self-pay | Admitting: Pulmonary Disease

## 2024-08-24 MED ORDER — PREDNISONE 10 MG PO TABS: 40.0000 mg | ORAL_TABLET | Freq: Every day | ORAL | 0 refills | Status: DC

## 2024-08-24 NOTE — Telephone Encounter (Signed)
 Oxygen  level is staying low 89 to 90%. She used albuterol  MDI 3 times today.  Does not have nebulizer. No wheezing or coughing.  She had an urgent care visit on 11/6.  Has not needed prednisone  recently. Calling for advice -feels fine Advised her to go to the ER if saturation falls less than 88% consistently, she can use her O2 concentrator meantime. Called in prednisone  40 mg daily for 5 days.  She will call for office visit if no better over the weekend

## 2024-08-28 NOTE — Telephone Encounter (Signed)
 Noted

## 2024-09-02 DIAGNOSIS — G4736 Sleep related hypoventilation in conditions classified elsewhere: Secondary | ICD-10-CM | POA: Diagnosis not present

## 2024-09-02 DIAGNOSIS — J449 Chronic obstructive pulmonary disease, unspecified: Secondary | ICD-10-CM | POA: Diagnosis not present

## 2024-09-24 ENCOUNTER — Ambulatory Visit: Payer: Self-pay | Admitting: Pulmonary Disease

## 2024-09-24 NOTE — Telephone Encounter (Signed)
 FYI Only or Action Required?: FYI only for provider: appointment scheduled on 09/25/2024. Patient does not have portable oxygen  and feels like she will need oxygen  on arrival to office  Patient is followed in Pulmonology for COPD, last seen on 04/23/2024 by Kara Dorn NOVAK, MD.  Called Nurse Triage reporting Shortness of Breath.  Symptoms began several months ago.  Interventions attempted: Rescue inhaler, Maintenance inhaler, and Home oxygen  use.  Symptoms are: gradually worsening.  Triage Disposition: See HCP Within 4 Hours (Or PCP Triage)  Patient/caregiver understands and will follow disposition?: Yes  Copied from CRM #[id number]. Topic: Clinical - Red Word Triage >> Sep 24, 2024 11:09 AM Corean SAUNDERS wrote: Red Word that prompted transfer to Nurse Triage: Trouble breathing Reason for Disposition  [1] MILD difficulty breathing (e.g., minimal/no SOB at rest, SOB with walking, pulse < 100) AND [2] NEW-onset or WORSE than normal  Answer Assessment - Initial Assessment Questions Reports increased use of supplemental oxygen .  Normally uses oxygen  at night always, but has found that she needs oxygen  during the day as well.   Reports that as she was working out on Friday, she felt more tired than usual and it felt harder than normal.  She started monitoring her oxygen  more closely and when it falls below 90%, she uses her oxygen .   This has occurred three times in the past two months  Concerned for increased need for oxygen   2. ONSET: When did this breathing problem begin?      About two months ago 3. PATTERN Does the difficult breathing come and go, or has it been constant since it started?      intermittent 4. SEVERITY: How bad is your breathing? (e.g., mild, moderate, severe)      mild  6. CARDIAC HISTORY: Do you have any history of heart disease? (e.g., heart attack, angina, bypass surgery, angioplasty)      denies 7. LUNG HISTORY: Do you have any history of lung  disease?  (e.g., pulmonary embolus, asthma, emphysema)     COPD 8. CAUSE: What do you think is causing the breathing problem?      unsure 9. OTHER SYMPTOMS: Do you have any other symptoms? (e.g., chest pain, cough, dizziness, fever, runny nose)     Mild productive cough and a little runny nose 10. O2 SATURATION MONITOR:  Do you use an oxygen  saturation monitor (pulse oximeter) at home? If Yes, ask: What is your reading (oxygen  level) today? What is your usual oxygen  saturation reading? (e.g., 95%)       Normal is 94%, has noticed increased need for daytime oxygen   Protocols used: Breathing Difficulty-A-AH

## 2024-09-24 NOTE — Progress Notes (Incomplete)
 "  Synopsis: Referred in May 2024 for emphysema by Valery Ripple, MD  Subjective:   PATIENT ID: Sarah Harrell GENDER: female DOB: 08-18-1941, MRN: [id number]  HPI  No chief complaint on file.  Sarah Harrell is an 83 year old woman, former smoker who returns to pulmonary clinic for emphysema and nocturnal hypoxemia.   She uses oxygen  at night without issues and is currently on Anoro, with infrequent use of albuterol . She exercises regularly, performing cardio sessions three times a week without drops in her oxygen  on personal monitoring. She has increased her exertion level. She experiences shortness of breath when working outside, likely due to the heat. She quit smoking over a year ago and now uses nicotine gum.    Past Medical History:  Diagnosis Date   Depressive disorder, not elsewhere classified    Elevated fasting glucose    Glucose 110 as of 09/13/13   Fatigue    Guaiac positive stools    Normal colonoscopy 2009   Insomnia, unspecified    Joint pain    Mixed hyperlipidemia    Systemic sclerosis (HCC)    Tobacco dependence    Vitamin D deficiency      Family History  Problem Relation Age of Onset   CAD Mother 67   Hypertension Father    Cancer Brother        pancreatic   Breast cancer Neg Hx      Social History   Socioeconomic History   Marital status: Divorced    Spouse name: Not on file   Number of children: Not on file   Years of education: Not on file   Highest education level: Master's degree (e.g., MA, MS, MEng, MEd, MSW, MBA)  Occupational History   Not on file  Tobacco Use   Smoking status: Former    Types: Cigarettes   Smokeless tobacco: Never   Tobacco comments:    Pt quit smoking 12-27-2022.  Substance and Sexual Activity   Alcohol use: Yes    Comment: daily   Drug use: No   Sexual activity: Not Currently  Other Topics Concern   Not on file  Social History Narrative   Not on file   Social Drivers of Health   Tobacco Use: Medium  Risk (08/02/2024)   Patient History    Smoking Tobacco Use: Former    Smokeless Tobacco Use: Never    Passive Exposure: Not on [place] Strain: Not on file  Food Insecurity: Not on file  Transportation Needs: Not on file  Physical Activity: Not on file  Stress: Not on file  Social Connections: Not on file  Intimate Partner Violence: Not on file  Depression (PHQ2-9): Low Risk (08/04/2023)   Depression (PHQ2-9)    PHQ-2 Score: 1  Alcohol Screen: Not on file  Housing: Not on file  Utilities: Not on file  Health Literacy: Not on file     Allergies  Allergen Reactions   Caffeine Hives     Outpatient Medications Prior to Visit  Medication Sig Dispense Refill   albuterol  (VENTOLIN  HFA) 108 (90 Base) MCG/ACT inhaler Inhale 1-2 puffs into the lungs every 6 (six) hours as needed.     amLODipine (NORVASC) 5 MG tablet Take 5 mg by mouth daily.     azithromycin  (ZITHROMAX ) 250 MG tablet Take 1 tablet (250 mg total) by mouth daily. Take first 2 tablets together, then 1 every day until finished. 6 tablet 0   citalopram (CELEXA) 20 MG tablet  Take 20 mg by mouth daily.     ECHINACEA PO      Multiple Vitamin (MULTIVITAMIN) tablet Take 1 tablet by mouth daily.     predniSONE  (DELTASONE ) 10 MG tablet Take 4 tablets (40 mg total) by mouth daily with breakfast. 20 tablet 0   rosuvastatin (CRESTOR) 10 MG tablet Take 10 mg by mouth daily.     traZODone (DESYREL) 100 MG tablet Take 50 mg by mouth at bedtime.     umeclidinium-vilanterol (ANORO ELLIPTA ) 62.5-25 MCG/ACT AEPB Inhale 1 puff into the lungs daily. 60 each 5   No facility-administered medications prior to visit.    Review of Systems  Constitutional:  Negative for chills, fever, malaise/fatigue and weight loss.  HENT:  Negative for congestion, sinus pain and sore throat.   Eyes: Negative.   Respiratory:  Positive for shortness of breath. Negative for cough, hemoptysis, sputum production and wheezing.   Cardiovascular:   Negative for chest pain, palpitations, orthopnea, claudication and leg swelling.  Gastrointestinal:  Negative for abdominal pain, heartburn, nausea and vomiting.  Genitourinary: Negative.   Musculoskeletal:  Negative for joint pain and myalgias.  Skin:  Negative for rash.  Neurological:  Negative for weakness.  Endo/Heme/Allergies: Negative.   Psychiatric/Behavioral: Negative.      Objective:   There were no vitals filed for this visit.    Physical Exam Constitutional:      General: She is not in acute distress.    Appearance: She is not ill-appearing.  HENT:     Head: Normocephalic and atraumatic.  Eyes:     General: No scleral icterus. Cardiovascular:     Rate and Rhythm: Normal rate and regular rhythm.     Pulses: Normal pulses.     Heart sounds: Normal heart sounds. No murmur heard. Pulmonary:     Effort: Pulmonary effort is normal.     Breath sounds: No wheezing, rhonchi or rales.  Musculoskeletal:     Right lower leg: No edema.     Left lower leg: No edema.  Skin:    General: Skin is warm and dry.  Neurological:     Mental Status: She is alert.    CBC No results found for: WBC, RBC, HGB, HCT, PLT, MCV, MCH, MCHC, RDW, LYMPHSABS, MONOABS, EOSABS, BASOSABS  Chest imaging: CT Chest 01/12/21 Cardiovascular: Atherosclerosis of thoracic aorta is noted without aneurysm formation. Normal cardiac size. No pericardial effusion.   Mediastinum/Nodes: Small sliding-type hiatal hernia is noted. No adenopathy is noted. Thyroid gland is unremarkable.   Lungs/Pleura: No pneumothorax or pleural effusion is noted. Emphysematous disease is noted bilaterally. Mild biapical scarring is noted. Right upper lobe pulmonary nodule noted on prior exam is not visualized currently. Scarring remains in the right upper lobe.  PFT:    Latest Ref Rng & Units 04/21/2023   10:57 AM  PFT Results  FVC-Pre L 1.26   FVC-Predicted Pre % 58   FVC-Post L 1.60    FVC-Predicted Post % 74   Pre FEV1/FVC % % 54   Post FEV1/FCV % % 48   FEV1-Pre L 0.68   FEV1-Predicted Pre % 43   FEV1-Post L 0.76   DLCO uncorrected ml/min/mmHg 9.70   DLCO UNC% % 59   DLCO corrected ml/min/mmHg 9.70   DLCO COR %Predicted % 59   DLVA Predicted % 76   TLC L 5.09   TLC % Predicted % 114   RV % Predicted % 173     Labs:  Path:  Echo:  Heart Catheterization:    Assessment & Plan:   No diagnosis found.  Discussion: Sarah Harrell is an 83 year old woman, daily smoker who returns to pulmonary clinic for emphysema.  Chronic Obstructive Pulmonary Disease (COPD) -Continue Anoro inhaler. - Use albuterol  inhaler 1-2 puffs every 4-6 hours as needed  Nocturnal Hypoxemia - continue 2L of oxygen  at night  Follow-up in 6 months or sooner if needed   Requiring more oxygen  on prednisone    Dorn Chill, MD [facility] Office: [phone]    Current Outpatient Medications:    albuterol  (VENTOLIN  HFA) 108 (90 Base) MCG/ACT inhaler, Inhale 1-2 puffs into the lungs every 6 (six) hours as needed., Disp: , Rfl:    amLODipine (NORVASC) 5 MG tablet, Take 5 mg by mouth daily., Disp: , Rfl:    azithromycin  (ZITHROMAX ) 250 MG tablet, Take 1 tablet (250 mg total) by mouth daily. Take first 2 tablets together, then 1 every day until finished., Disp: 6 tablet, Rfl: 0   citalopram (CELEXA) 20 MG tablet, Take 20 mg by mouth daily., Disp: , Rfl:    ECHINACEA PO, , Disp: , Rfl:    Multiple Vitamin (MULTIVITAMIN) tablet, Take 1 tablet by mouth daily., Disp: , Rfl:    predniSONE  (DELTASONE ) 10 MG tablet, Take 4 tablets (40 mg total) by mouth daily with breakfast., Disp: 20 tablet, Rfl: 0   rosuvastatin (CRESTOR) 10 MG tablet, Take 10 mg by mouth daily., Disp: , Rfl:    traZODone (DESYREL) 100 MG tablet, Take 50 mg by mouth at bedtime., Disp: , Rfl:    umeclidinium-vilanterol (ANORO ELLIPTA ) 62.5-25 MCG/ACT AEPB, Inhale 1 puff into the lungs daily., Disp:  60 each, Rfl: 5   "

## 2024-09-25 ENCOUNTER — Ambulatory Visit

## 2024-09-25 VITALS — BP 132/70 | HR 103 | Temp 98.0°F | Ht 60.0 in | Wt 130.4 lb

## 2024-09-25 DIAGNOSIS — J439 Emphysema, unspecified: Secondary | ICD-10-CM

## 2024-09-25 DIAGNOSIS — J432 Centrilobular emphysema: Secondary | ICD-10-CM

## 2024-09-25 DIAGNOSIS — J9611 Chronic respiratory failure with hypoxia: Secondary | ICD-10-CM

## 2024-09-25 DIAGNOSIS — J449 Chronic obstructive pulmonary disease, unspecified: Secondary | ICD-10-CM

## 2024-09-25 NOTE — Patient Instructions (Addendum)
 Dear Ms. Ladouceur I will recommend to use Oxygen  when your oxygen  drops < 88%. I will recommend to check your oxygen  with exertion to check your numbers.   I highly recommend you to continue to exercise using the oxygen .   Continue Anoro.   Continue care with Dr. Kara.

## 2024-10-03 ENCOUNTER — Telehealth: Payer: Self-pay

## 2024-10-03 NOTE — Telephone Encounter (Addendum)
" °  Spoke with patient Paperwork up front ready to be picked up   Spoke with [facility], will send message to LOV provider if she agrees that patient would benefit use of one        Copied from CRM #[id number]. Topic: Clinical - Medical Advice >> Oct 01, 2024  2:52 PM Leila C wrote: Reason for CRM: Patient [phone] states just got portable oxygen  device and needs a handicap sticker. How does patient get this done? Unable to reach CAL. Please advise and call back. "

## 2024-10-23 ENCOUNTER — Ambulatory Visit: Admitting: Pulmonary Disease

## 2024-10-24 ENCOUNTER — Encounter: Payer: Self-pay | Admitting: Pulmonary Disease

## 2024-10-24 ENCOUNTER — Ambulatory Visit: Admitting: Pulmonary Disease

## 2024-10-24 VITALS — BP 124/52 | HR 72 | Wt 134.0 lb

## 2024-10-24 DIAGNOSIS — J9611 Chronic respiratory failure with hypoxia: Secondary | ICD-10-CM

## 2024-10-24 DIAGNOSIS — J449 Chronic obstructive pulmonary disease, unspecified: Secondary | ICD-10-CM | POA: Diagnosis not present

## 2024-10-24 MED ORDER — TRELEGY ELLIPTA 100-62.5-25 MCG/ACT IN AEPB: 1.0000 | INHALATION_SPRAY | Freq: Every day | RESPIRATORY_TRACT | 11 refills | Status: AC

## 2024-10-24 NOTE — Patient Instructions (Addendum)
 Complete your current Anoro inhaler and then transition to Trelegy ellipta   Start trelegy ellipta  1 puff daily - rinse mouth out after each use  Continue albuterol  inhaler as needed  Schedule pulmonary function tests at the front desk   We will schedule you for CT Chest scan  Continue supplemental oxygen  for goal Oxygen  saturation 88% or higher  Based on the breathing tests and CT Chest scan we will consider getting an echocardiogram of the heart  Follow up in 2 months, call sooner if needed

## 2024-10-24 NOTE — Progress Notes (Signed)
 "  Established Patient Pulmonology Office Visit   Subjective:  Patient ID: Sarah Harrell, female    DOB: May 05, 1941  MRN: [id number]  CC:  Chief Complaint  Patient presents with   Medical Management of Chronic Issues    Pt states has some questions on her EKG / Xray that she had didn't get much info on those / pulm rehab ?    Discussed the use of AI scribe software for clinical note transcription with the patient, who gave verbal consent to proceed.  History of Present Illness Sarah Harrell is an 84 year old woman, former smoker who returns to pulmonary clinic for emphysema and chronic hypoxemic respiratory failure.   Since November she has had a sudden increase in oxygen  needs with shortness of breath, for which she was treated at urgent care with azithromycin  and prednisone . She followed up with Dr. Adrien 09/25/24 and qualified for supplemental oxygen  with exertion. She uses a home concentrator and notes oxygen  saturation drops when she is off it. Current inhaler therapy is Anoro Ellipta  one puff daily. She has not used Trelegy or Breztri. She denies frequent wheezing. She has not attended the gym for five to six weeks and wants to resume pulmonary rehabilitation. She is unsure how hard she can safely exert herself before stopping due to shortness of breath.        ROS   Current Medications[1]      Objective:  BP (!) 124/52   Pulse 72   Wt 134 lb (60.8 kg)   SpO2 97%   PF (!) 3 L/min   BMI 26.17 kg/m     Physical Exam Constitutional:      General: She is not in acute distress.    Appearance: Normal appearance.  Eyes:     General: No scleral icterus.    Conjunctiva/sclera: Conjunctivae normal.  Cardiovascular:     Rate and Rhythm: Normal rate and regular rhythm.  Pulmonary:     Breath sounds: Wheezing (mild diffuse - end expiratory) present. No rhonchi or rales.  Musculoskeletal:     Right lower leg: No edema.     Left lower leg: No edema.  Skin:    General: Skin  is warm and dry.  Neurological:     General: No focal deficit present.      Diagnostic Review:  Last CBC No results found for: WBC, HGB, HCT, MCV, MCH, RDW, PLT Last metabolic panel No results found for: GLUCOSE, NA, K, CL, CO2, BUN, CREATININE, EGFR, CALCIUM, PHOS, PROT, ALBUMIN, LABGLOB, AGRATIO, BILITOT, ALKPHOS, AST, ALT, ANIONGAP     Assessment & Plan:   Assessment & Plan Chronic obstructive pulmonary disease, unspecified COPD type (HCC)  Orders:   Fluticasone-Umeclidin-Vilant (TRELEGY ELLIPTA ) 100-62.5-25 MCG/ACT AEPB; Inhale 1 puff into the lungs daily.   Pulmonary Function Test; Future   CT CHEST HIGH RESOLUTION; Future   AMB referral to pulmonary rehabilitation  Chronic hypoxic respiratory failure (HCC)      Assessment and Plan Assessment & Plan Chronic obstructive pulmonary disease with chronic hypoxic respiratory failure - Ordered chest CT scan to evaluate for lung changes. - Scheduled pulmonary function tests. - Prescribed Trelegy Ellipta , one puff daily, with samples provided. - Referred to pulmonary rehabilitation for monitored exercise program. - Consider echocardiogram  - Consider Ohtuvayre  nebulizer medication if symptoms persist.      Return in about 2 months (around 12/22/2024) for f/u visit Dr. Kara.   Sarah KATHEE Kara, MD     [1]  Current  Outpatient Medications:    albuterol  (VENTOLIN  HFA) 108 (90 Base) MCG/ACT inhaler, Inhale 1-2 puffs into the lungs every 6 (six) hours as needed., Disp: , Rfl:    amLODipine (NORVASC) 5 MG tablet, Take 5 mg by mouth daily., Disp: , Rfl:    citalopram (CELEXA) 20 MG tablet, Take 20 mg by mouth daily., Disp: , Rfl:    ECHINACEA PO, , Disp: , Rfl:    Fluticasone-Umeclidin-Vilant (TRELEGY ELLIPTA ) 100-62.5-25 MCG/ACT AEPB, Inhale 1 puff into the lungs daily., Disp: 28 each, Rfl: 11   Multiple Vitamin (MULTIVITAMIN) tablet, Take 1 tablet by mouth daily.,  Disp: , Rfl:    rosuvastatin (CRESTOR) 10 MG tablet, Take 10 mg by mouth daily., Disp: , Rfl:    traZODone (DESYREL) 100 MG tablet, Take 50 mg by mouth at bedtime., Disp: , Rfl:   "

## 2024-10-26 ENCOUNTER — Telehealth (HOSPITAL_COMMUNITY): Payer: Self-pay

## 2024-10-26 NOTE — Telephone Encounter (Signed)
 Called pt, she is interested in [facility]. Would like her insurance run before making an appointment. Will pass along to support team to verify insurance/benefits.

## 2024-10-28 ENCOUNTER — Other Ambulatory Visit: Payer: Self-pay | Admitting: Pulmonary Disease

## 2024-10-28 DIAGNOSIS — J432 Centrilobular emphysema: Secondary | ICD-10-CM

## 2024-11-01 ENCOUNTER — Ambulatory Visit
Admission: RE | Admit: 2024-11-01 | Discharge: 2024-11-01 | Disposition: A | Source: Ambulatory Visit | Attending: Pulmonary Disease

## 2024-11-01 ENCOUNTER — Telehealth (HOSPITAL_COMMUNITY): Payer: Self-pay

## 2024-11-01 ENCOUNTER — Encounter (HOSPITAL_COMMUNITY): Payer: Self-pay

## 2024-11-01 DIAGNOSIS — J449 Chronic obstructive pulmonary disease, unspecified: Secondary | ICD-10-CM

## 2024-11-01 NOTE — Telephone Encounter (Signed)
 Called patient to see if she was interested in participating in the Pulmonary Rehab Program. Patient will come in for orientation on 2/16@1  and will attend the 1:15 exercise class.   Sent package.

## 2024-11-01 NOTE — Telephone Encounter (Signed)
 Pt insurance is active and benefits verified through [facility] Co-pay $30, DED 0/0 met, out of pocket $5,900/$59.10 met, co-insurance 0%. no pre-authorization required, Sarah Harrell 11/01/24@1 :17, REF# [id number]  No limit for pulmonary rehab

## 2024-11-12 ENCOUNTER — Encounter (HOSPITAL_COMMUNITY)

## 2024-11-20 ENCOUNTER — Encounter (HOSPITAL_COMMUNITY)

## 2024-11-22 ENCOUNTER — Encounter (HOSPITAL_COMMUNITY)

## 2024-11-27 ENCOUNTER — Encounter (HOSPITAL_COMMUNITY)

## 2024-11-29 ENCOUNTER — Encounter (HOSPITAL_COMMUNITY)

## 2024-12-04 ENCOUNTER — Encounter (HOSPITAL_COMMUNITY)

## 2024-12-06 ENCOUNTER — Encounter (HOSPITAL_COMMUNITY)

## 2024-12-11 ENCOUNTER — Encounter (HOSPITAL_COMMUNITY)

## 2024-12-12 ENCOUNTER — Ambulatory Visit: Admitting: Primary Care

## 2024-12-12 ENCOUNTER — Encounter

## 2024-12-13 ENCOUNTER — Encounter (HOSPITAL_COMMUNITY)

## 2024-12-18 ENCOUNTER — Encounter (HOSPITAL_COMMUNITY)

## 2024-12-20 ENCOUNTER — Encounter (HOSPITAL_COMMUNITY)

## 2024-12-25 ENCOUNTER — Encounter (HOSPITAL_COMMUNITY)

## 2024-12-27 ENCOUNTER — Encounter (HOSPITAL_COMMUNITY)

## 2025-01-01 ENCOUNTER — Encounter (HOSPITAL_COMMUNITY)

## 2025-01-03 ENCOUNTER — Encounter (HOSPITAL_COMMUNITY)

## 2025-01-08 ENCOUNTER — Encounter (HOSPITAL_COMMUNITY)

## 2025-01-10 ENCOUNTER — Encounter (HOSPITAL_COMMUNITY)

## 2025-01-15 ENCOUNTER — Encounter (HOSPITAL_COMMUNITY)

## 2025-01-17 ENCOUNTER — Encounter (HOSPITAL_COMMUNITY)

## 2025-01-22 ENCOUNTER — Encounter (HOSPITAL_COMMUNITY)

## 2025-01-24 ENCOUNTER — Encounter (HOSPITAL_COMMUNITY)

## 2025-01-29 ENCOUNTER — Encounter (HOSPITAL_COMMUNITY)

## 2025-01-31 ENCOUNTER — Encounter (HOSPITAL_COMMUNITY)

## 2025-02-05 ENCOUNTER — Encounter (HOSPITAL_COMMUNITY)

## 2025-02-07 ENCOUNTER — Encounter (HOSPITAL_COMMUNITY)
